# Patient Record
Sex: Male | Born: 1970 | Race: White | Hispanic: No | Smoking: Former smoker
Health system: Southern US, Community
[De-identification: ages and names within clinical notes are randomized; demographics above are authoritative.]

## PROBLEM LIST (undated history)

## (undated) DIAGNOSIS — I10 Essential (primary) hypertension: Secondary | ICD-10-CM

## (undated) HISTORY — DX: Essential (primary) hypertension: I10

## (undated) HISTORY — PX: COSMETIC SURGERY: SHX468

---

## 2000-10-09 ENCOUNTER — Encounter: Payer: Self-pay | Admitting: Family Medicine

## 2000-10-09 ENCOUNTER — Ambulatory Visit (HOSPITAL_COMMUNITY): Admission: RE | Admit: 2000-10-09 | Discharge: 2000-10-09 | Payer: Self-pay | Admitting: Family Medicine

## 2000-10-16 ENCOUNTER — Encounter: Payer: Self-pay | Admitting: Family Medicine

## 2000-10-16 ENCOUNTER — Ambulatory Visit (HOSPITAL_COMMUNITY): Admission: RE | Admit: 2000-10-16 | Discharge: 2000-10-16 | Payer: Self-pay | Admitting: Family Medicine

## 2002-04-20 ENCOUNTER — Emergency Department (HOSPITAL_COMMUNITY): Admission: EM | Admit: 2002-04-20 | Discharge: 2002-04-20 | Payer: Self-pay | Admitting: *Deleted

## 2006-04-27 ENCOUNTER — Emergency Department (HOSPITAL_COMMUNITY): Admission: EM | Admit: 2006-04-27 | Discharge: 2006-04-27 | Payer: Self-pay | Admitting: Emergency Medicine

## 2015-09-19 ENCOUNTER — Emergency Department (HOSPITAL_COMMUNITY)
Admission: EM | Admit: 2015-09-19 | Discharge: 2015-09-19 | Disposition: A | Discharge status: Other Acute Inpatient Hospital | Payer: BLUE CROSS/BLUE SHIELD | Attending: Psychiatry | Admitting: Psychiatry

## 2015-09-19 ENCOUNTER — Encounter (HOSPITAL_COMMUNITY): Payer: Self-pay | Admitting: *Deleted

## 2015-09-19 ENCOUNTER — Emergency Department (HOSPITAL_COMMUNITY): Admission: EM | Admit: 2015-09-19 | Discharge: 2015-09-19 | Discharge status: Absent without leave | Payer: Self-pay

## 2015-09-19 ENCOUNTER — Encounter (HOSPITAL_COMMUNITY): Payer: Self-pay | Admitting: Emergency Medicine

## 2015-09-19 ENCOUNTER — Observation Stay (HOSPITAL_COMMUNITY)
Admission: EM | Admit: 2015-09-19 | Discharge: 2015-09-20 | Disposition: A | Discharge status: Home / Self Care | Payer: BLUE CROSS/BLUE SHIELD | Source: Intra-hospital | Attending: Psychiatry | Admitting: Psychiatry

## 2015-09-19 DIAGNOSIS — F329 Major depressive disorder, single episode, unspecified: Secondary | ICD-10-CM

## 2015-09-19 DIAGNOSIS — R45851 Suicidal ideations: Secondary | ICD-10-CM | POA: Diagnosis not present

## 2015-09-19 DIAGNOSIS — F320 Major depressive disorder, single episode, mild: Secondary | ICD-10-CM | POA: Diagnosis present

## 2015-09-19 DIAGNOSIS — F4323 Adjustment disorder with mixed anxiety and depressed mood: Secondary | ICD-10-CM | POA: Diagnosis not present

## 2015-09-19 DIAGNOSIS — F17200 Nicotine dependence, unspecified, uncomplicated: Secondary | ICD-10-CM | POA: Insufficient documentation

## 2015-09-19 DIAGNOSIS — F32A Depression, unspecified: Secondary | ICD-10-CM

## 2015-09-19 DIAGNOSIS — F17210 Nicotine dependence, cigarettes, uncomplicated: Secondary | ICD-10-CM | POA: Diagnosis not present

## 2015-09-19 LAB — COMPREHENSIVE METABOLIC PANEL
ALBUMIN: 5.1 g/dL — AB (ref 3.5–5.0)
ALK PHOS: 63 U/L (ref 38–126)
ALT: 21 U/L (ref 17–63)
AST: 24 U/L (ref 15–41)
Anion gap: 9 (ref 5–15)
BILIRUBIN TOTAL: 2.8 mg/dL — AB (ref 0.3–1.2)
BUN: 12 mg/dL (ref 6–20)
CHLORIDE: 105 mmol/L (ref 101–111)
CO2: 24 mmol/L (ref 22–32)
CREATININE: 0.92 mg/dL (ref 0.61–1.24)
Calcium: 9.6 mg/dL (ref 8.9–10.3)
GFR calc non Af Amer: >60 mL/min (ref >60)
Glucose, Bld: 111 mg/dL — ABNORMAL HIGH (ref 65–99)
Potassium: 3.8 mmol/L (ref 3.5–5.1)
Sodium: 138 mmol/L (ref 135–145)
Total Protein: 7.6 g/dL (ref 6.5–8.1)

## 2015-09-19 LAB — ETHANOL: Alcohol, Ethyl (B): <5 mg/dL (ref <5)

## 2015-09-19 LAB — CBC
HEMATOCRIT: 43.6 % (ref 39.0–52.0)
HEMOGLOBIN: 15.2 g/dL (ref 13.0–17.0)
MCH: 32.8 pg (ref 26.0–34.0)
MCHC: 34.9 g/dL (ref 30.0–36.0)
MCV: 94.2 fL (ref 78.0–100.0)
Platelets: 237 10*3/uL (ref 150–400)
RBC: 4.63 MIL/uL (ref 4.22–5.81)
RDW: 12.2 % (ref 11.5–15.5)
WBC: 10 10*3/uL (ref 4.0–10.5)

## 2015-09-19 LAB — SALICYLATE LEVEL: Salicylate Lvl: <4 mg/dL (ref 2.8–30.0)

## 2015-09-19 LAB — RAPID URINE DRUG SCREEN, HOSP PERFORMED
AMPHETAMINES: NOT DETECTED
Barbiturates: NOT DETECTED
Benzodiazepines: NOT DETECTED
Cocaine: NOT DETECTED
Opiates: NOT DETECTED
TETRAHYDROCANNABINOL: NOT DETECTED

## 2015-09-19 LAB — ACETAMINOPHEN LEVEL: Acetaminophen (Tylenol), Serum: <10 ug/mL — ABNORMAL LOW (ref 10–30)

## 2015-09-19 MED ORDER — ACETAMINOPHEN 325 MG PO TABS: 650.0000 mg | ORAL_TABLET | Freq: Four times a day (QID) | ORAL | Status: DC | PRN

## 2015-09-19 MED ORDER — ALUM & MAG HYDROXIDE-SIMETH 200-200-20 MG/5ML PO SUSP: 30.0000 mL | ORAL | Status: DC | PRN

## 2015-09-19 MED ORDER — MAGNESIUM HYDROXIDE 400 MG/5ML PO SUSP: 30.0000 mL | Freq: Every day | ORAL | Status: DC | PRN

## 2015-09-19 MED ORDER — TRAZODONE HCL 50 MG PO TABS
50.0000 mg | ORAL_TABLET | Freq: Every evening | ORAL | Status: DC | PRN
  Administered 2015-09-19: 50 mg via ORAL

## 2015-09-19 MED ORDER — TRAZODONE HCL 50 MG PO TABS
ORAL_TABLET | ORAL | Status: AC
  Administered 2015-09-19: 23:00:00
  Filled 2015-09-19: qty 1

## 2015-09-19 NOTE — BH Assessment (Addendum)
Tele Assessment Note   Alex Mata is an 45 y.o. white male that reports vague SI without a plan.  Patient reports increased depression and anxiety due to his wife and children leaving him in February 2017.  Patient reports that his father died in November 28, 2016of cancer.    Patient reports that he is not able to focus or concentrate ion anything.  Patient reports that he misses his children because he is not able to see them everyday.   Patient reports that he blames himself for the separation with his wife.   However, patient would not state why he blames himself.  Patient reports that he lives alone and has worked as a Lobbyist for the past seven years at [employer]  Patient would cover his face several times throughout the assessment and then state that, "I should have coe to the ER a long time ago, I ned help".     Patient denies prior psychiatric hospitalization.  Patient denies prior medication management or outpatient therapy.  Patient denies HI/Psychosis/Substance Abuse.  Patient denies physical, sexual or emotional abuse.    Diagnosis: Major Depressive Disorder   Past Medical History: History reviewed. No pertinent past medical history.  History reviewed. No pertinent past surgical history.  Family History: History reviewed. No pertinent family history.  Social History:  reports that he has been smoking.  He does not have any smokeless tobacco history on file. He reports that he drinks alcohol. He reports that he does not use illicit drugs.  Additional Social History:  Alcohol / Drug Use History of alcohol / drug use?: No history of alcohol / drug abuse  CIWA: CIWA-Ar BP: 141/92 mmHg Pulse Rate: 97 COWS:    PATIENT STRENGTHS: (choose at least two) Average or above average intelligence Capable of independent living Psychologist, counselling means Physical Health Special hobby/interest Supportive family/friends Work skills  Allergies: No Known  Allergies  Home Medications:  (Not in a hospital admission)  OB/GYN Status:  No LMP for male patient.  General Assessment Data Location of Assessment: [facility] TTS Assessment: In system Is this a Tele or Face-to-Face Assessment?: Tele Assessment Is this an Initial Assessment or a Re-assessment for this encounter?: Initial Assessment Marital status: Separated Maiden name: NA Is patient pregnant?: No Pregnancy Status: No Living Arrangements: Alone Can pt return to current living arrangement?: Yes Admission Status: Voluntary Is patient capable of signing voluntary admission?: Yes Referral Source: Self/Family/Friend Insurance type: Medical sales representative     Crisis Care Plan Living Arrangements: Alone Legal Guardian:  (NA) Name of Psychiatrist: None Reported Name of Therapist: None Reported  Education Status Is patient currently in school?: No Current Grade: NA Highest grade of school patient has completed: 12th Name of school: NA Contact person: NA  Risk to self with the past 6 months Suicidal Ideation: No-Not Currently/Within Last 6 Months Has patient been a risk to self within the past 6 months prior to admission? : Yes Suicidal Intent: No Has patient had any suicidal intent within the past 6 months prior to admission? : No Is patient at risk for suicide?: No Suicidal Plan?: No Has patient had any suicidal plan within the past 6 months prior to admission? : No Access to Means: No What has been your use of drugs/alcohol within the last 12 months?: None Reported Previous Attempts/Gestures: No How many times?: 0 Other Self Harm Risks: None Reported Triggers for Past Attempts: None known Intentional Self Injurious Behavior: None Family Suicide History:  No Recent stressful life event(s): Divorce, Financial Problems, Loss (Comment) (Father died six months ago) Persecutory voices/beliefs?: No Depression: Yes Depression Symptoms: Despondent, Insomnia, Isolating, Fatigue, Guilt, Loss of  interest in usual pleasures, Feeling worthless/self pity, Feeling angry/irritable Substance abuse history and/or treatment for substance abuse?: No Suicide prevention information given to non-admitted patients: Yes  Risk to Others within the past 6 months Homicidal Ideation: No Does patient have any lifetime risk of violence toward others beyond the six months prior to admission? : No Thoughts of Harm to Others: No Current Homicidal Intent: No Current Homicidal Plan: No Access to Homicidal Means: No Identified Victim: NA History of harm to others?: No Assessment of Violence: None Noted Violent Behavior Description: None  Does patient have access to weapons?: No Criminal Charges Pending?: No Does patient have a court date: No Is patient on probation?: No  Psychosis Hallucinations: None noted Delusions: None noted  Mental Status Report Appearance/Hygiene: Disheveled Eye Contact: Good Motor Activity: Freedom of movement Speech: Logical/coherent Level of Consciousness: Alert, Restless Mood: Anxious, Depressed Affect: Anxious, Depressed Anxiety Level: Minimal Thought Processes: Coherent, Relevant Judgement: Unimpaired Orientation: Person, Place, Time, Situation Obsessive Compulsive Thoughts/Behaviors: None  Cognitive Functioning Concentration: Decreased Memory: Recent Intact, Remote Intact IQ: Average Insight: Fair Impulse Control: Fair Appetite: Fair Weight Loss: 0 Weight Gain: 0 Sleep: Decreased Total Hours of Sleep: 3 Vegetative Symptoms: Decreased grooming  ADLScreening [facility] Assessment Services) Patient's cognitive ability adequate to safely complete daily activities?: Yes Patient able to express need for assistance with ADLs?: Yes Independently performs ADLs?: Yes (appropriate for developmental age)  Prior Inpatient Therapy Prior Inpatient Therapy: No Prior Therapy Dates: NA Prior Therapy Facilty/Provider(s): NA Reason for Treatment: NA  Prior Outpatient  Therapy Prior Outpatient Therapy: No Prior Therapy Dates: NA Prior Therapy Facilty/Provider(s): NA Reason for Treatment: NA Does patient have an ACCT team?: No Does patient have Intensive In-House Services?  : No Does patient have [facility] services? : No Does patient have P4CC services?: No  ADL Screening (condition at time of admission) Patient's cognitive ability adequate to safely complete daily activities?: Yes Is the patient deaf or have difficulty hearing?: No Does the patient have difficulty seeing, even when wearing glasses/contacts?: No Does the patient have difficulty concentrating, remembering, or making decisions?: No Patient able to express need for assistance with ADLs?: Yes Does the patient have difficulty dressing or bathing?: No Independently performs ADLs?: Yes (appropriate for developmental age) Does the patient have difficulty walking or climbing stairs?: No Weakness of Legs: None Weakness of Arms/Hands: None  Home Assistive Devices/Equipment Home Assistive Devices/Equipment: None    Abuse/Neglect Assessment (Assessment to be complete while patient is alone) Physical Abuse: Denies Verbal Abuse: Denies Sexual Abuse: Denies Exploitation of patient/patient's resources: Denies Self-Neglect: Denies Values / Beliefs Cultural Requests During Hospitalization: None Spiritual Requests During Hospitalization: None Consults Spiritual Care Consult Needed: No Social Work Consult Needed: No Merchant navy officerAdvance Directives (For Healthcare) Does patient have an advance directive?: No Would patient like information on creating an advanced directive?: No - patient declined information    Additional Information 1:1 In Past 12 Months?: No CIRT Risk: No Elopement Risk: No Does patient have medical clearance?: Yes     Disposition: Per Shuvon, NP - patient accepted to the observation unit Bed 2 after 8pm.  Writer informed the ER RN.   Disposition Initial Assessment Completed for  this Encounter: Yes  Linton RumpStevenson, Birdia Jaycox LaVerne 09/19/2015 4:44 PM

## 2015-09-19 NOTE — ED Notes (Signed)
[facility], RN unavailable.

## 2015-09-19 NOTE — ED Notes (Signed)
Secretary called [facility]

## 2015-09-19 NOTE — BH Assessment (Signed)
Per Denice BorsShuvon, NP - patient meets criteria for OBS.  [facility] patient accepted to OBS Bed 2 after 8pm.  Writer informed the ER RN of the patients disposition.

## 2015-09-19 NOTE — ED Notes (Signed)
PT c/o difficulty with thought process, anxiety attacks and jittery with SI x3 days. PT states he needs some help and brother brought him to ED today for eval.

## 2015-09-19 NOTE — ED Notes (Signed)
[facility] called and advised RN [facility] after 2000 tonight.

## 2015-09-19 NOTE — ED Notes (Signed)
Pt's belongings given to pt's brother per patient request.

## 2015-09-19 NOTE — ED Notes (Signed)
Security has wanded pt.  

## 2015-09-19 NOTE — ED Provider Notes (Signed)
CSN: [id number]     Arrival date & time 09/19/15  1435 History   First MD Initiated Contact with Patient 09/19/15 1555     Chief Complaint  Patient presents with  . V70.1      HPI Pt was seen at 1555. Per pt and his brother, c/o gradual onset and worsening of persistent depression for the past several months, worse over the past several days. Pt states he believes his symptoms began "when my wife left me." Pt's brother states he was called by pt's daughter who "was worried about him," and "we think he's had a nervous breakdown." Pt endorses difficulty with thought processes, anxiety, and vague SI. Denies plan. Denies hallucinations, no HI, no SA.    History reviewed. No pertinent past medical history.   History reviewed. No pertinent past surgical history.  Social History  Substance Use Topics  . Smoking status: Light Tobacco Smoker  . Smokeless tobacco: None  . Alcohol Use: Yes     Comment: more often lately    Review of Systems ROS: Statement: All systems negative except as marked or noted in the HPI; Constitutional: Negative for fever and chills. ; ; Eyes: Negative for eye pain, redness and discharge. ; ; ENMT: Negative for ear pain, hoarseness, nasal congestion, sinus pressure and sore throat. ; ; Cardiovascular: Negative for chest pain, palpitations, diaphoresis, dyspnea and peripheral edema. ; ; Respiratory: Negative for cough, wheezing and stridor. ; ; Gastrointestinal: Negative for nausea, vomiting, diarrhea, abdominal pain, blood in stool, hematemesis, jaundice and rectal bleeding. . ; ; Genitourinary: Negative for dysuria, flank pain and hematuria. ; ; Musculoskeletal: Negative for back pain and neck pain. Negative for swelling and trauma.; ; Skin: Negative for pruritus, rash, abrasions, blisters, bruising and skin lesion.; ; Neuro: Negative for headache, lightheadedness and neck stiffness. Negative for weakness, altered level of consciousness , altered mental status, extremity  weakness, paresthesias, involuntary movement, seizure and syncope.; Psych:  +depression, anxiety, vague SI. No SA, no HI, no hallucinations.      Allergies  Review of patient's allergies indicates no known allergies.  Home Medications   Prior to Admission medications   Not on File   BP 141/92 mmHg  Pulse 97  Temp(Src) 97.3 F (36.3 C) (Oral)  Resp 18  Ht  (1.88 m)  Wt 185 lb (83.915 kg)  BMI 23.74 kg/m2  SpO2 98% Physical Exam  1600; Physical examination:  Nursing notes reviewed; Vital signs and O2 SAT reviewed;  Constitutional: Well developed, Well nourished, Well hydrated, In no acute distress; Head:  Normocephalic, atraumatic; Eyes: EOMI, PERRL, No scleral icterus; ENMT: Mouth and pharynx normal, Mucous membranes moist; Neck: Supple, Full range of motion; Cardiovascular: Regular rate and rhythm; Respiratory: Breath sounds clear, No wheezes.  Speaking full sentences with ease, Normal respiratory effort/excursion; Chest: No deformity, Movement normal; Abdomen: Soft, Nontender. Nondistended; Extremities: No deformity.; Neuro: AA&Ox3, Major CN grossly intact.  Speech clear. No gross focal motor deficits in extremities. Climbs on and off stretcher easily by himself. Gait steady.; Skin: Color normal, Warm, Dry.; Psych:  Affect flat, poor eye contact.      ED Course  Procedures (including critical care time) Labs Review  Imaging Review  I have personally reviewed and evaluated these images and lab results as part of my medical decision-making.   EKG Interpretation None      MDM  MDM Reviewed: previous chart, nursing note and vitals Interpretation: labs   Results for orders placed or performed during the  hospital encounter of 09/19/15  Comprehensive metabolic panel  Result Value Ref Range   Sodium 138 135 - 145 mmol/L   Potassium 3.8 3.5 - 5.1 mmol/L   Chloride 105 101 - 111 mmol/L   CO2 24 22 - 32 mmol/L   Glucose, Bld 111 (H) 65 - 99 mg/dL   BUN 12 6 - 20  mg/dL   Creatinine, Ser 1.610.92 0.61 - 1.24 mg/dL   Calcium 9.6 8.9 - 09.610.3 mg/dL   Total Protein 7.6 6.5 - 8.1 g/dL   Albumin 5.1 (H) 3.5 - 5.0 g/dL   AST 24 15 - 41 U/L   ALT 21 17 - 63 U/L   Alkaline Phosphatase 63 38 - 126 U/L   Total Bilirubin 2.8 (H) 0.3 - 1.2 mg/dL   GFR calc non Af Amer >60 >60 mL/min   GFR calc Af Amer >60 >60 mL/min   Anion gap 9 5 - 15  Ethanol (ETOH)  Result Value Ref Range   Alcohol, Ethyl (B) <5 <5 mg/dL  Salicylate level  Result Value Ref Range   Salicylate Lvl <4.0 2.8 - 30.0 mg/dL  Acetaminophen level  Result Value Ref Range   Acetaminophen (Tylenol), Serum <10 (L) 10 - 30 ug/mL  CBC  Result Value Ref Range   WBC 10.0 4.0 - 10.5 K/uL   RBC 4.63 4.22 - 5.81 MIL/uL   Hemoglobin 15.2 13.0 - 17.0 g/dL   HCT 04.543.6 40.939.0 - 81.152.0 %   MCV 94.2 78.0 - 100.0 fL   MCH 32.8 26.0 - 34.0 pg   MCHC 34.9 30.0 - 36.0 g/dL   RDW 91.412.2 78.211.5 - 95.615.5 %   Platelets 237 150 - 400 K/uL  Urine rapid drug screen (hosp performed) (Not [facility])  Result Value Ref Range   Opiates NONE DETECTED NONE DETECTED   Cocaine NONE DETECTED NONE DETECTED   Benzodiazepines NONE DETECTED NONE DETECTED   Amphetamines NONE DETECTED NONE DETECTED   Tetrahydrocannabinol NONE DETECTED NONE DETECTED   Barbiturates NONE DETECTED NONE DETECTED     1655:  TTS eval pending.   1800:  TTS has evaluated pt: pt meets obs criteria, [facility].     Samuel JesterKathleen Sarahmarie Leavey, DO 09/23/15 1756

## 2015-09-20 ENCOUNTER — Encounter (HOSPITAL_COMMUNITY): Payer: Self-pay | Admitting: Registered Nurse

## 2015-09-20 DIAGNOSIS — F4323 Adjustment disorder with mixed anxiety and depressed mood: Secondary | ICD-10-CM | POA: Diagnosis not present

## 2015-09-20 DIAGNOSIS — F320 Major depressive disorder, single episode, mild: Secondary | ICD-10-CM

## 2015-09-20 MED ORDER — ACETAMINOPHEN 325 MG PO TABS
650.0000 mg | ORAL_TABLET | Freq: Four times a day (QID) | ORAL | Status: DC | PRN
  Administered 2015-09-20: 650 mg via ORAL

## 2015-09-20 MED ORDER — TRAZODONE HCL 50 MG PO TABS: 50.0000 mg | ORAL_TABLET | Freq: Every evening | ORAL | Status: DC | PRN

## 2015-09-20 NOTE — Progress Notes (Signed)
Admission Note:  Patient is a 45 year old male from [facility]. Patient is on wheelchair but could not say why he is on wheelchair. Patient is irritable and responds "I don't know to every question this Clinical research associatewriter asked". Patient responded 'I can't stand when asked to stand to get his weight and height. Patient was not able to cooperate with the admission process.  This Clinical research associatewriter called [facility] to know why the patient ambulates on wheelchair since no prior information was given on report. The [facility] staff said that patient never used wheelchair while he was with them and he ambulates well.  "Patient  Delphina CahillWalked himself to the car" A: Skin/body search - no contraband found, no bruises/wound noted. POC and unit policies explained and understanding verbalized. Consents obtained. Accepted food and fluids offered.  R: Patient had no additional questions or concerns.

## 2015-09-20 NOTE — Discharge Instructions (Signed)
You are encouraged to continue with your scheduled appointment on 09/21/15 with your primary care Dr. Pearson GrippeJames Kim at 5:30 p.m. You are also encouraged [facility] and [facility] for continuation of care.

## 2015-09-20 NOTE — Progress Notes (Signed)
Patient to be discharged, belongings returned to patient

## 2015-09-20 NOTE — Discharge Summary (Signed)
Physician Discharge Summary Note  Patient:  Alex MossesRobert L Mata is an 45 y.o., male MRN:  [id number] DOB:  [birth year] Patient phone:  [phone] (home)  Patient address[place]   [address] [place] [id number],  Total Time spent with patient: 30 minutes  Date of Admission:  09/19/2015 Date of Discharge: 09/20/15  Reason for Admission:  Worsening anxiety, depression, and passive suicidal thoughts without a plan  Principal Problem: MDD (major depressive disorder), single episode, mild [facility](HCC) Discharge Diagnoses: Patient Active Problem List   Diagnosis Date Noted  . MDD (major depressive disorder), single episode, mild (HCC) [F32.0] 09/20/2015  . Adjustment disorder with mixed anxiety and depressed mood [F43.23] 09/20/2015  . MDD (major depressive disorder) (HCC) [F32.9] 09/19/2015    Past Psychiatric History: No prior psych history  Past Medical History: History reviewed. No pertinent past medical history. History reviewed. No pertinent past surgical history. Family History: History reviewed. No pertinent family history. Family Psychiatric  History: No family psych history Social History:  History  Alcohol Use  . Yes    Comment: more often lately     History  Drug Use No    Social History   Social History  . Marital Status: Legally Separated    Spouse Name: N/A  . Number of Children: N/A  . Years of Education: N/A   Social History Main Topics  . Smoking status: Light Tobacco Smoker  . Smokeless tobacco: None  . Alcohol Use: Yes     Comment: more often lately  . Drug Use: No  . Sexual Activity: Not Asked   Other Topics Concern  . None   Social History Narrative    Hospital Course:  [facility] for MDD (major depressive disorder), single episode, mild (HCC) and crisis management.  He was treated with the following medication Trazodone 50 mg for sleep.  Medication were tolerated with no adverse reactions.  Alex Mata  was discharged with no medication.    Improvement was monitored by observation and Alex Mata report of symptom reduction;  Patient reported that he is feeling better that he just felt overwhelmed yesterday with the children and had a panic attack; feels that he does want outpatient psych services and willing to start medication if needed.  Emotional and mental status was also monitored by staff.          Alex Mata was evaluated for stability and plans for continued recovery upon discharge.  Alex Mata motivation was an integral factor for scheduling further treatment.  Employment, transportation, bed availability, health status, family support, and any pending legal issues were also considered during his during the 24 hour observation.  He was offered further treatment options upon discharge including but not limited to Residential, Intensive Outpatient, Outpatient treatment, and resources for shelters if needed.  Alex Mossesobert L Liz will follow up with the services as listed below under Follow up Information.     Upon completion of this admission the [employer] General[employer] Camp was both mentally and medically stable for discharge denying suicidal/homicidal ideation, auditory/visual/tactile hallucinations, delusional thoughts and paranoia.      Physical Findings: AIMS:  , ,  ,  ,    CIWA:    COWS:     Musculoskeletal: Strength & Muscle Tone: within normal limits Gait & Station: normal Patient leans: N/A  Psychiatric Specialty Exam: ROS Psychiatric/Behavioral: Positive for depression (Stable). Negative for memory loss. Suicidal ideas: Denies. Hallucinations: Denies. Substance abuse:  ETOH. The patient is nervous/anxious (Stable). The patient does not have insomnia.   All other systems reviewed and are negative.   Blood pressure 138/87, pulse 70, temperature 98 F (36.7 C), temperature source Oral, resp. rate 14, height  (1.88 m), weight 83.915 kg (185 lb), SpO2 100 %.Body mass index is  23.74 kg/(m^2).    Musculoskeletal: Strength & Muscle Tone: within normal limits Gait & Station: normal Patient leans: N/A  Psychiatric Specialty Exam: Blood pressure 138/87, pulse 70, temperature 98 F (36.7 C), temperature source Oral, resp. rate 14, height  (1.88 m), weight 83.915 kg (185 lb), SpO2 100 %.Body mass index is 23.74 kg/(m^2).  General Appearance: Casual and Fairly Groomed  Eye Contact::  Good  Speech:  Blocked and Normal Rate  Volume:  Normal  Mood:  Anxious  Affect:  Appropriate  Thought Process:  Coherent and Goal Directed  Orientation:  Full (Time, Place, and Person)  Thought Content:  WDL  Suicidal Thoughts:  No  Homicidal Thoughts:  No  Memory:  Immediate;   Good Recent;   Good Remote;   Good  Judgement:  Intact  Insight:  Present  Psychomotor Activity:  Normal  Concentration:  Good  Recall:  Good  Fund of Knowledge:Good  Language: Good  Akathisia:  No  Handed:  Right  AIMS (if indicated):     Assets:  Communication Skills Desire for Improvement Housing Physical Health Resilience Social Support Transportation  ADL's:  Intact  Cognition: WNL  Sleep:       Metabolic Disorder Labs:  No results found for: HGBA1C, MPG No results found for: PROLACTIN No results found for: CHOL, TRIG, HDL, CHOLHDL, VLDL, LDLCALC  See Psychiatric Specialty Exam and Suicide Risk Assessment completed by Attending Physician prior to discharge.  Discharge destination:  Home  Is patient on multiple antipsychotic therapies at discharge:  No   Has Patient had three or more failed trials of antipsychotic monotherapy by history:  No  Recommended Plan for Multiple Antipsychotic Therapies: NA      Discharge Instructions    Activity as tolerated - No restrictions    Complete by:  As directed      Diet general    Complete by:  As directed      Discharge instructions    Complete by:  As directed   Keep all scheduled appointments.  Be sure to follow up with  resources and follow ups given. In the event of worsening symptoms call the crisis hotline, 911, and or go to the nearest emergency department for appropriate evaluation and treatment of symptoms. Follow-up with your primary care provider for your medical issues, concerns and or health care needs.            Medication List    Notice    You have not been prescribed any medications.     Follow-up Information    Follow up with [facility]. Go in 1 day.   Why:  Follow up and continuation of care,   Contact information:   [address] [place] [address] [phone]       Follow up with [facility] Call in 1 day.   Why:  continuation of care/ therapy   Contact information:   [address]  [address] [place] [address] [phone]       Follow up with Dr. Pearson Grippe. Go in 1 day.   Why:  already scheduled appointment   Contact information:  [address] [place], [place] [phone]      Follow-up recommendations:  Activity:  As tolerated Diet:  As tolerated  Comments:  Alex Mata has been instructed to follow up with primary doctor for any medical issues and If symptoms recur report to nearest emergency or crisis hot line.    SignedAssunta Found, NP 09/20/2015, 2:10 PM

## 2015-09-20 NOTE — H&P (Signed)
Observation: Psychiatric Admission Assessment Adult  Patient Identification: Alex Mata MRN:  [id number] Date of Evaluation:  09/20/2015 Chief Complaint:  MDD Principal Diagnosis: MDD (major depressive disorder), single episode, mild ([place]) Diagnosis:   Patient Active Problem List   Diagnosis Date Noted  . MDD (major depressive disorder), single episode, mild ([place]) [F32.0] 09/20/2015  . Adjustment disorder with mixed anxiety and depressed mood [F43.23] 09/20/2015  . MDD (major depressive disorder) ([place]) [F32.9] 09/19/2015   History of Present Illness:: Alex Mata 45 yr old white male admitted to [facility] for 24 hour observation after present to [facility] with complaints of worsening anxiety, depression since February 2017, and passive suicidal thoughts without a plan. Patient reports that his stressor for anxiety and depression is related to he and his wife getting a divorce.  States yesterday he had the kids for the weekend and they were out of control and he had a panic attack; his brother talked him into going to the hospital.  States that he is feeling better now and would like information on resources for outpatient services.  At this time patient denies suicidal/homicidal ideation, psychosis, and paranoia.  Patient is employed at [employer]); reports that he has a good support system (mother, brother, "and even my ex-wife).  Patient also reports that he had a drinking problem which he quit "cold [place]" states that he hasn't drank any alcohol in 3 weeks.   Associated Signs/Symptoms: Depression Symptoms:  depressed mood, suicidal thoughts without plan, anxiety, panic attacks, (Hypo) Manic Symptoms:  Impulsivity, Irritable Mood, Anxiety Symptoms:  Excessive Worry, Panic Symptoms, Psychotic Symptoms:  Denies PTSD Symptoms: Denies Total Time spent with patient: 30 minutes  Past Psychiatric History: Denies prior psych history  Is the patient at risk to self? No.   Has the patient been a risk to self in the past 6 months? No.  Has the patient been a risk to self within the distant past? No.  Is the patient a risk to others? No.  Has the patient been a risk to others in the past 6 months? No.  Has the patient been a risk to others within the distant past? No.   Prior Inpatient Therapy:   Prior Outpatient Therapy:    Alcohol Screening: 1. How often do you have a drink containing alcohol?: Monthly or less 2. How many drinks containing alcohol do you have on a typical day when you are drinking?: 1 or 2 3. How often do you have six or more drinks on one occasion?: Never Preliminary Score: 0 9. Have you or someone else been injured as a result of your drinking?: No 10. Has a relative or friend or a doctor or another health worker been concerned about your drinking or suggested you cut down?: No Alcohol Use Disorder Identification Test Final Score (AUDIT): 1 Brief Intervention: AUDIT score less than 7 or less-screening does not suggest unhealthy drinking-brief intervention not indicated Substance Abuse History in the last 12 months:  Yes.   Consequences of Substance Abuse: Denies Previous Psychotropic Medications: No  Psychological Evaluations: No  Past Medical History: History reviewed. No pertinent past medical history. History reviewed. No pertinent past surgical history. Family History: History reviewed. No pertinent family history. Family Psychiatric  History: Denies family psych history Tobacco Screening: @FLOW ([phone])::1)@ Social History:  History  Alcohol Use  . Yes    Comment: more often lately     History  Drug Use No    Additional Social History:  Allergies:  No Known Allergies Lab Results:  Results for orders placed or performed during the hospital encounter of 09/19/15 (from the past 48 hour(s))  Urine rapid drug screen (hosp performed) (Not at [facility])     Status: None   Collection Time:  09/19/15  4:00 PM  Result Value Ref Range   Opiates NONE DETECTED NONE DETECTED   Cocaine NONE DETECTED NONE DETECTED   Benzodiazepines NONE DETECTED NONE DETECTED   Amphetamines NONE DETECTED NONE DETECTED   Tetrahydrocannabinol NONE DETECTED NONE DETECTED   Barbiturates NONE DETECTED NONE DETECTED    Comment:        DRUG SCREEN FOR MEDICAL PURPOSES ONLY.  IF CONFIRMATION IS NEEDED FOR ANY PURPOSE, NOTIFY LAB WITHIN 5 DAYS.        LOWEST DETECTABLE LIMITS FOR URINE DRUG SCREEN Drug Class       Cutoff (ng/mL) Amphetamine      1000 Barbiturate      200 Benzodiazepine   235 Tricyclics       573 Opiates          300 Cocaine          300 THC              50   Comprehensive metabolic panel     Status: Abnormal   Collection Time: 09/19/15  4:13 PM  Result Value Ref Range   Sodium 138 135 - 145 mmol/L   Potassium 3.8 3.5 - 5.1 mmol/L   Chloride 105 101 - 111 mmol/L   CO2 24 22 - 32 mmol/L   Glucose, Bld 111 (H) 65 - 99 mg/dL   BUN 12 6 - 20 mg/dL   Creatinine, Ser 0.92 0.61 - 1.24 mg/dL   Calcium 9.6 8.9 - 10.3 mg/dL   Total Protein 7.6 6.5 - 8.1 g/dL   Albumin 5.1 (H) 3.5 - 5.0 g/dL   AST 24 15 - 41 U/L   ALT 21 17 - 63 U/L   Alkaline Phosphatase 63 38 - 126 U/L   Total Bilirubin 2.8 (H) 0.3 - 1.2 mg/dL   GFR calc non Af Amer >60 >60 mL/min   GFR calc Af Amer >60 >60 mL/min    Comment: (NOTE) The eGFR has been calculated using the CKD EPI equation. This calculation has not been validated in all clinical situations. eGFR's persistently <60 mL/min signify possible Chronic Kidney Disease.    Anion gap 9 5 - 15  Ethanol (ETOH)     Status: None   Collection Time: 09/19/15  4:13 PM  Result Value Ref Range   Alcohol, Ethyl (B) <5 <5 mg/dL    Comment:        LOWEST DETECTABLE LIMIT FOR SERUM ALCOHOL IS 5 mg/dL FOR MEDICAL PURPOSES ONLY   Salicylate level     Status: None   Collection Time: 09/19/15  4:13 PM  Result Value Ref Range   Salicylate Lvl <2.2 2.8 - 30.0  mg/dL  Acetaminophen level     Status: Abnormal   Collection Time: 09/19/15  4:13 PM  Result Value Ref Range   Acetaminophen (Tylenol), Serum <10 (L) 10 - 30 ug/mL    Comment:        THERAPEUTIC CONCENTRATIONS VARY SIGNIFICANTLY. A RANGE OF 10-30 ug/mL MAY BE AN EFFECTIVE CONCENTRATION FOR MANY PATIENTS. HOWEVER, SOME ARE BEST TREATED AT CONCENTRATIONS OUTSIDE THIS RANGE. ACETAMINOPHEN CONCENTRATIONS >150 ug/mL AT 4 HOURS AFTER INGESTION AND >50 ug/mL AT 12 HOURS AFTER INGESTION ARE OFTEN ASSOCIATED WITH TOXIC  REACTIONS.   CBC     Status: None   Collection Time: 09/19/15  4:13 PM  Result Value Ref Range   WBC 10.0 4.0 - 10.5 K/uL   RBC 4.63 4.22 - 5.81 MIL/uL   Hemoglobin 15.2 13.0 - 17.0 g/dL   HCT 43.6 39.0 - 52.0 %   MCV 94.2 78.0 - 100.0 fL   MCH 32.8 26.0 - 34.0 pg   MCHC 34.9 30.0 - 36.0 g/dL   RDW 12.2 11.5 - 15.5 %   Platelets 237 150 - 400 K/uL    Blood Alcohol level:  Lab Results  Component Value Date   ETH <5 62/56/3893    Metabolic Disorder Labs:  No results found for: HGBA1C, MPG No results found for: PROLACTIN No results found for: CHOL, TRIG, HDL, CHOLHDL, VLDL, LDLCALC  Current Medications: Current Facility-Administered Medications  Medication Dose Route Frequency Provider Last Rate Last Dose  . acetaminophen (TYLENOL) tablet 650 mg  650 mg Oral Q6H PRN Hampton Abbot, MD   650 mg at 09/20/15 1204  . alum & mag hydroxide-simeth (MAALOX/MYLANTA) 200-200-20 MG/5ML suspension 30 mL  30 mL Oral Q4H PRN Shuvon B Rankin, NP      . magnesium hydroxide (MILK OF MAGNESIA) suspension 30 mL  30 mL Oral Daily PRN Shuvon B Rankin, NP      . traZODone (DESYREL) tablet 50 mg  50 mg Oral QHS PRN Hampton Abbot, MD       No current outpatient prescriptions on file.   PTA Medications: No prescriptions prior to admission    Musculoskeletal: Strength & Muscle Tone: within normal limits Gait & Station: normal Patient leans: N/A  Psychiatric Specialty  Exam: Physical Exam  Neck: Normal range of motion.  Respiratory: Effort normal.  Musculoskeletal: Normal range of motion.  Neurological: He is alert.  Skin: Skin is warm and dry.  Psychiatric: His behavior is normal. Thought content normal. His mood appears anxious. Thought content is not paranoid and not delusional. He expresses impulsivity. He exhibits a depressed mood. He expresses no homicidal and no suicidal ideation.    Review of Systems  Psychiatric/Behavioral: Positive for depression (Stable). Negative for memory loss. Suicidal ideas: Denies. Hallucinations: Denies. Substance abuse: ETOH. The patient is nervous/anxious (Stable). The patient does not have insomnia.   All other systems reviewed and are negative.   Blood pressure 138/87, pulse 70, temperature 98 F (36.7 C), temperature source Oral, resp. rate 14, height 6' 2"  (1.88 m), weight 83.915 kg (185 lb), SpO2 100 %.Body mass index is 23.74 kg/(m^2).  General Appearance: Casual and Fairly Groomed  Eye Contact::  Good  Speech:  Blocked and Normal Rate  Volume:  Normal  Mood:  Anxious  Affect:  Appropriate  Thought Process:  Coherent and Goal Directed  Orientation:  Full (Time, Place, and Person)  Thought Content:  WDL  Suicidal Thoughts:  No  Homicidal Thoughts:  No  Memory:  Immediate;   Good Recent;   Good Remote;   Good  Judgement:  Intact  Insight:  Present  Psychomotor Activity:  Normal  Concentration:  Good  Recall:  Good  Fund of Knowledge:Good  Language: Good  Akathisia:  No  Handed:  Right  AIMS (if indicated):     Assets:  Communication Skills Desire for [place] [employer]  ADL's:  Intact  Cognition: WNL  Sleep:        Treatment Plan Summary: Plan Discharge home with resource for outpatient psych services  Observation Level/Precautions:  15 minute checks for safety  Laboratory:  CBC Chemistry Profile UDS [facility] Hospital labs reviewed   Psychotherapy:  As appropriate; Outpatient therapy recommended  Medications:  Trazodone 50 mg Q hs prn for sleep  Consultations:  As appropriate  Discharge Concerns:  Safety, stabilization, and access to medication  Estimated LOS:  24 hours  Other:      Disposition: Discharge home.  Resources information given for outpatient services (medication management and therapy)  Follow-up Information    Follow up with [facility]. Go in 1 day.   Why:  Follow up and continuation of care,   Contact information:   [address] [place] [address] [phone]       Follow up with [facility]. Call in 1 day.   Why:  continuation of care/ therapy   Contact information:   [address]  [place] [address] [phone]       Follow up with Dr. Jani Gravel. Go in 1 day.   Why:  already scheduled appointment   Contact information:   [address] [place], [place] [phone]      I certify that inpatient services furnished can reasonably be expected to improve the patient's condition.    Rankin, Delphia Grates, NP 3/19/20171:59 PM

## 2015-09-20 NOTE — [facility] Counselor (Signed)
Spoke with pt. This a.m. Pt. States he is ready to return home and states that he will follow up with his Dr. In [place], unsure of name and exact location at this time.  Alex Mata, LCAS-A, LPC-A, [facility]  Counselor 09/20/2015 8:32 AM

## 2015-09-20 NOTE — Progress Notes (Signed)
Patient is increasingly anxious. Patient speech is clear and logical. He is fidgety and states that bed is uncomfortable. Patient reports no SI, HI, and AVH. Patient is able to walk to bathroom and is getting up from recliner frequently to walk around.  Patient safety maintained through constant observation. Encouragement and support offered.   Patient is compliant at this time. Will continue to monitor.

## 2015-09-23 ENCOUNTER — Telehealth: Payer: Self-pay

## 2015-09-23 NOTE — Telephone Encounter (Signed)
Pt was referred by Dr. Selena BattenKim for colonoscopy. Pt is 45 years old, but noted in chart pt has had abdominal pain. I called the phone number that Dr. Selena BattenKim sent of [phone] and it was not his number. I called the other two numbers listed in epic and one did not work and the other was not his number either. I am mailing the pt a letter to call.

## 2015-09-27 ENCOUNTER — Encounter (HOSPITAL_COMMUNITY): Payer: Self-pay

## 2015-09-27 ENCOUNTER — Emergency Department (HOSPITAL_COMMUNITY)
Admission: EM | Admit: 2015-09-27 | Discharge: 2015-09-28 | Disposition: A | Discharge status: Home / Self Care | Payer: BLUE CROSS/BLUE SHIELD | Attending: Emergency Medicine | Admitting: Emergency Medicine

## 2015-09-27 DIAGNOSIS — F17210 Nicotine dependence, cigarettes, uncomplicated: Secondary | ICD-10-CM | POA: Insufficient documentation

## 2015-09-27 DIAGNOSIS — F329 Major depressive disorder, single episode, unspecified: Secondary | ICD-10-CM | POA: Insufficient documentation

## 2015-09-27 DIAGNOSIS — F4323 Adjustment disorder with mixed anxiety and depressed mood: Secondary | ICD-10-CM | POA: Diagnosis not present

## 2015-09-27 LAB — COMPREHENSIVE METABOLIC PANEL
ALT: 19 U/L (ref 17–63)
AST: 20 U/L (ref 15–41)
Albumin: 4.4 g/dL (ref 3.5–5.0)
Alkaline Phosphatase: 53 U/L (ref 38–126)
Anion gap: 8 (ref 5–15)
BUN: 10 mg/dL (ref 6–20)
CHLORIDE: 106 mmol/L (ref 101–111)
CO2: 24 mmol/L (ref 22–32)
CREATININE: 0.7 mg/dL (ref 0.61–1.24)
Calcium: 8.9 mg/dL (ref 8.9–10.3)
GFR calc Af Amer: >60 mL/min (ref >60)
GLUCOSE: 115 mg/dL — AB (ref 65–99)
Potassium: 3.2 mmol/L — ABNORMAL LOW (ref 3.5–5.1)
Sodium: 138 mmol/L (ref 135–145)
Total Bilirubin: 2.3 mg/dL — ABNORMAL HIGH (ref 0.3–1.2)
Total Protein: 6.8 g/dL (ref 6.5–8.1)

## 2015-09-27 LAB — CBC WITH DIFFERENTIAL/PLATELET
Basophils Absolute: 0 10*3/uL (ref 0.0–0.1)
Basophils Relative: 0 %
EOS PCT: 0 %
Eosinophils Absolute: 0 10*3/uL (ref 0.0–0.7)
HEMATOCRIT: 39.8 % (ref 39.0–52.0)
Hemoglobin: 14 g/dL (ref 13.0–17.0)
LYMPHS ABS: 1.2 10*3/uL (ref 0.7–4.0)
LYMPHS PCT: 18 %
MCH: 32.6 pg (ref 26.0–34.0)
MCHC: 35.2 g/dL (ref 30.0–36.0)
MCV: 92.8 fL (ref 78.0–100.0)
MONO ABS: 0.6 10*3/uL (ref 0.1–1.0)
MONOS PCT: 9 %
NEUTROS ABS: 5 10*3/uL (ref 1.7–7.7)
Neutrophils Relative %: 73 %
PLATELETS: 206 10*3/uL (ref 150–400)
RBC: 4.29 MIL/uL (ref 4.22–5.81)
RDW: 12.2 % (ref 11.5–15.5)
WBC: 6.8 10*3/uL (ref 4.0–10.5)

## 2015-09-27 LAB — ETHANOL

## 2015-09-27 MED ORDER — ACETAMINOPHEN 325 MG PO TABS: 650.0000 mg | ORAL_TABLET | ORAL | Status: DC | PRN

## 2015-09-27 MED ORDER — ONDANSETRON HCL 4 MG PO TABS: 4.0000 mg | ORAL_TABLET | Freq: Three times a day (TID) | ORAL | Status: DC | PRN

## 2015-09-27 MED ORDER — LORAZEPAM 1 MG PO TABS: 1.0000 mg | ORAL_TABLET | Freq: Three times a day (TID) | ORAL | Status: DC | PRN

## 2015-09-27 MED ORDER — IBUPROFEN 400 MG PO TABS: 600.0000 mg | ORAL_TABLET | Freq: Three times a day (TID) | ORAL | Status: DC | PRN

## 2015-09-27 MED ORDER — ALUM & MAG HYDROXIDE-SIMETH 200-200-20 MG/5ML PO SUSP: 30.0000 mL | ORAL | Status: DC | PRN

## 2015-09-27 MED ORDER — POTASSIUM CHLORIDE CRYS ER 20 MEQ PO TBCR
40.0000 meq | EXTENDED_RELEASE_TABLET | Freq: Every day | ORAL | Status: DC
  Administered 2015-09-28: 40 meq via ORAL
  Filled 2015-09-27: qty 2

## 2015-09-27 NOTE — ED Notes (Addendum)
I keep having panic attacks and I don't know why.  He has been confused. Does not know where he is at. Not resting well per sister in law. It started around a year ago. It has been worse over the past 2 weeks. He was seen and sent to [facility] around two weeks ago.

## 2015-09-27 NOTE — ED Provider Notes (Signed)
CSN: [id number]     Arrival date & time 09/27/15  2123 History  By signing my name below, I, [facility], attest that this documentation has been prepared under the direction and in the presence of Alex CoreNathan Kerin Cecchi, MD. Electronically Signed: Randell PatientMarrissa Mata, ED Scribe. 09/27/2015. 11:42 PM.   No chief complaint on file.  The history is provided by the Mata and a relative. No language interpreter was used.   HPI Comments: Alex Mata is a 45 y.o. male brought in by his sister-in-law with an hx of depression who presents to the Emergency Department complaining of intermittent, moderate anxiety attacks ongoing for the past week. Mata reports that he has good and bad days [facility]. He endorses associated dysphoric mood and sister-in-law endorses sleep disturbance. Per medical records, he was seen 8 days ago by Dr. Clarene DukeMcManus where he was evaluated by TTS and accepted to [facility] for observation with mild follow-up. He is currently going through an acrimonious divorce. He has consumed EtOH but states it was a small amount. Denies illicit drug abuse. Denies SI.  History reviewed. No pertinent past medical history. History reviewed. No pertinent past surgical history. No family history on file. Social History  Substance Use Topics  . Smoking status: Light Tobacco Smoker  . Smokeless tobacco: None  . Alcohol Use: Yes     Comment: more often lately    Review of Systems  Psychiatric/Behavioral: Positive for sleep disturbance and dysphoric mood. Negative for suicidal ideas. The Mata is nervous/anxious.     Allergies  Review of Mata's allergies indicates no known allergies.  Home Medications   Prior to Admission medications   Not on File   BP 142/92 mmHg  Pulse 93  Temp(Src) 97.8 F (36.6 C) (Oral)  Resp 18  SpO2 100% Physical Exam  Constitutional: He appears well-developed and well-nourished.  HENT:  Head: Normocephalic and  atraumatic.  Cardiovascular: Normal rate and regular rhythm.   Pulmonary/Chest: Effort normal and breath sounds normal. No respiratory distress.  Lungs CTA bilaterally.  Abdominal: Soft. There is no tenderness.  Psychiatric: He exhibits a depressed mood.  Depressed.  Nursing note and vitals reviewed.   ED Course  Procedures    COORDINATION OF CARE: 10:16 PM Will order Ativan, ibuprofen, Tylenol, Maalox, and labs. Will consult with TTS. Discussed treatment plan with pt at bedside and pt agreed to plan.   Labs Review Labs Reviewed  COMPREHENSIVE METABOLIC PANEL - Abnormal; Notable for the following:    Potassium 3.2 (*)    Glucose, Bld 115 (*)    Total Bilirubin 2.3 (*)    All other components within normal limits  ETHANOL  CBC WITH DIFFERENTIAL/PLATELET  URINE RAPID DRUG SCREEN, HOSP PERFORMED    Imaging Review No results found. I have personally reviewed and evaluated these images and lab results as part of my medical decision-making.   EKG Interpretation None      MDM   Final diagnoses:  Single current episode of major depressive disorder, unspecified depression episode severity (HCC)  Mata was depression. Recently had been seen at behavioral health but has gotten worse or not improved. No real suicidal thoughts at this time but has not been functioning well at home. Appears medically cleared except for mild hypokalemia that'll be oral supplemented. To be seen by TTS. Mata is voluntary at this time.  I personally performed the services described in this documentation, which was scribed in my presence. The recorded information has been reviewed  and is accurate.      Alex Core, MD 09/27/15 [phone]

## 2015-09-28 ENCOUNTER — Encounter: Payer: Self-pay | Admitting: Internal Medicine

## 2015-09-28 ENCOUNTER — Emergency Department (HOSPITAL_COMMUNITY): Payer: BLUE CROSS/BLUE SHIELD

## 2015-09-28 DIAGNOSIS — F4323 Adjustment disorder with mixed anxiety and depressed mood: Secondary | ICD-10-CM

## 2015-09-28 LAB — RAPID URINE DRUG SCREEN, HOSP PERFORMED
AMPHETAMINES: NOT DETECTED
BARBITURATES: NOT DETECTED
Benzodiazepines: NOT DETECTED
Cocaine: NOT DETECTED
Opiates: NOT DETECTED
TETRAHYDROCANNABINOL: POSITIVE — AB

## 2015-09-28 LAB — URINALYSIS, ROUTINE W REFLEX MICROSCOPIC
Glucose, UA: NEGATIVE mg/dL
Hgb urine dipstick: NEGATIVE
KETONES UR: NEGATIVE mg/dL
LEUKOCYTES UA: NEGATIVE
NITRITE: NEGATIVE
PH: 8 (ref 5.0–8.0)
PROTEIN: NEGATIVE mg/dL
Specific Gravity, Urine: 1.005 (ref 1.005–1.030)

## 2015-09-28 NOTE — Consult Note (Signed)
Telepsych Consultation   Reason for Consult:  Depressive symptoms Referring Physician: EDP Patient Identification: Alex Mata MRN:  [id number] Principal Diagnosis: Adjustment disorder with mixed anxiety and depressed mood Diagnosis:   Patient Active Problem List   Diagnosis Date Noted  . MDD (major depressive disorder), single episode, mild ([place]) [F32.0] 09/20/2015  . Adjustment disorder with mixed anxiety and depressed mood [F43.23] 09/20/2015  . MDD (major depressive disorder) ([place]) [F32.9] 09/19/2015    Total Time spent with patient: 30 minutes  Subjective:   Alex Mata is a 45 y.o. male patient admitted with symptoms of depression and anxiety. Patient states "Yes I have been depressed. I lost my father and am getting divorced from wife. I think that things built up. I have not made it to get the outpatient treatment. I feel better today. The bad moods come and go. I would never hurt myself because of my family. I need to get back to work but I will make every effort to get seen. I do think that I might need something for depression at this point."   HPI:    Alex Mata is a 45 year old male who has developed symptoms of depression and anxiety over the last two years. He reports that it started with the death of his father stating "Things will never be the same." Patient describes period of depressed mood alternating with normal ones. He admits that thinking about his losses triggers the episodes of panic. Patient states "I probably never got the help I needed. Just kept pushing through life." [name] denies any suicidal thoughts but appears to have a depressed mood. He is alert and oriented during the assessment. Patient encouraged to pursue outpatient treatment as he does not feel an inpatient admission would be justified. Patient reports being open to the idea of being started on a medication to address his depressed and anxious mood. The patient was recently released from the  [facility] unit where he was not started on any psychotropic medication but rather provided with resources for outpatient follow up. Patient denies any current psychotic symptoms or those consistent with manic episodes. He reports that his children are a protective factor for him and a motivation to get better.   Past Psychiatric History: MDD single episode   Risk to Self: Suicidal Ideation: No-Not Currently/Within Last 6 Months Suicidal Intent: No Is patient at risk for suicide?: No Suicidal Plan?: No Access to Means: No What has been your use of drugs/alcohol within the last 12 months?: None reported Intentional Self Injurious Behavior: None Risk to Others: Homicidal Ideation: No Thoughts of Harm to Others: No Current Homicidal Intent: No Current Homicidal Plan: No Access to Homicidal Means: No History of harm to others?: No Assessment of Violence: None Noted Does patient have access to weapons?: No Criminal Charges Pending?: No Does patient have a court date: No Prior Inpatient Therapy: Prior Inpatient Therapy: Yes Prior Therapy Dates: 3.18.17-3.19.17 Prior Therapy Facilty/Provider(s): [facility] Reason for Treatment: anxiety, depression and psassive SI w/o plan Prior Outpatient Therapy: Prior Outpatient Therapy: No Does patient have an ACCT team?: No Does patient have Intensive In-House Services?  : No Does patient have [facility] services? : No Does patient have P4CC services?: No  Past Medical History: History reviewed. No pertinent past medical history. History reviewed. No pertinent past surgical history. Family History: No family history on file. Social History:  History  Alcohol Use  . Yes    Comment: more often lately  History  Drug Use No    Social History   Social History  . Marital Status: Legally Separated    Spouse Name: N/A  . Number of Children: N/A  . Years of Education: N/A   Social History Main Topics  . Smoking status: Light Tobacco Smoker  .  Smokeless tobacco: None  . Alcohol Use: Yes     Comment: more often lately  . Drug Use: No  . Sexual Activity: Not Asked   Other Topics Concern  . None   Social History Narrative   Additional Social History:    Allergies:  No Known Allergies  Labs:  Results for orders placed or performed during the hospital encounter of 09/27/15 (from the past 48 hour(s))  Comprehensive metabolic panel     Status: Abnormal   Collection Time: 09/27/15 10:46 PM  Result Value Ref Range   Sodium 138 135 - 145 mmol/L   Potassium 3.2 (L) 3.5 - 5.1 mmol/L   Chloride 106 101 - 111 mmol/L   CO2 24 22 - 32 mmol/L   Glucose, Bld 115 (H) 65 - 99 mg/dL   BUN 10 6 - 20 mg/dL   Creatinine, Ser 0.70 0.61 - 1.24 mg/dL   Calcium 8.9 8.9 - 10.3 mg/dL   Total Protein 6.8 6.5 - 8.1 g/dL   Albumin 4.4 3.5 - 5.0 g/dL   AST 20 15 - 41 U/L   ALT 19 17 - 63 U/L   Alkaline Phosphatase 53 38 - 126 U/L   Total Bilirubin 2.3 (H) 0.3 - 1.2 mg/dL   GFR calc non Af Amer >60 >60 mL/min   GFR calc Af Amer >60 >60 mL/min    Comment: (NOTE) The eGFR has been calculated using the CKD EPI equation. This calculation has not been validated in all clinical situations. eGFR's persistently <60 mL/min signify possible Chronic Kidney Disease.    Anion gap 8 5 - 15  Ethanol     Status: None   Collection Time: 09/27/15 10:46 PM  Result Value Ref Range   Alcohol, Ethyl (B) <5 <5 mg/dL    Comment:        LOWEST DETECTABLE LIMIT FOR SERUM ALCOHOL IS 5 mg/dL FOR MEDICAL PURPOSES ONLY   CBC with Differential     Status: None   Collection Time: 09/27/15 10:46 PM  Result Value Ref Range   WBC 6.8 4.0 - 10.5 K/uL   RBC 4.29 4.22 - 5.81 MIL/uL   Hemoglobin 14.0 13.0 - 17.0 g/dL   HCT 39.8 39.0 - 52.0 %   MCV 92.8 78.0 - 100.0 fL   MCH 32.6 26.0 - 34.0 pg   MCHC 35.2 30.0 - 36.0 g/dL   RDW 12.2 11.5 - 15.5 %   Platelets 206 150 - 400 K/uL   Neutrophils Relative % 73 %   Neutro Abs 5.0 1.7 - 7.7 K/uL   Lymphocytes Relative  18 %   Lymphs Abs 1.2 0.7 - 4.0 K/uL   Monocytes Relative 9 %   Monocytes Absolute 0.6 0.1 - 1.0 K/uL   Eosinophils Relative 0 %   Eosinophils Absolute 0.0 0.0 - 0.7 K/uL   Basophils Relative 0 %   Basophils Absolute 0.0 0.0 - 0.1 K/uL  Urine rapid drug screen (hosp performed)     Status: Abnormal   Collection Time: 09/28/15  8:19 AM  Result Value Ref Range   Opiates NONE DETECTED NONE DETECTED   Cocaine NONE DETECTED NONE DETECTED   Benzodiazepines NONE DETECTED  NONE DETECTED   Amphetamines NONE DETECTED NONE DETECTED   Tetrahydrocannabinol POSITIVE (A) NONE DETECTED   Barbiturates NONE DETECTED NONE DETECTED    Comment:        DRUG SCREEN FOR MEDICAL PURPOSES ONLY.  IF CONFIRMATION IS NEEDED FOR ANY PURPOSE, NOTIFY LAB WITHIN 5 DAYS.        LOWEST DETECTABLE LIMITS FOR URINE DRUG SCREEN Drug Class       Cutoff (ng/mL) Amphetamine      1000 Barbiturate      200 Benzodiazepine   426 Tricyclics       834 Opiates          300 Cocaine          300 THC              50   Urinalysis, Routine w reflex microscopic (not at [facility])     Status: Abnormal   Collection Time: 09/28/15  8:20 AM  Result Value Ref Range   Color, Urine YELLOW YELLOW   APPearance CLEAR CLEAR   Specific Gravity, Urine 1.005 1.005 - 1.030   pH 8.0 5.0 - 8.0   Glucose, UA NEGATIVE NEGATIVE mg/dL   Hgb urine dipstick NEGATIVE NEGATIVE   Bilirubin Urine SMALL (A) NEGATIVE   Ketones, ur NEGATIVE NEGATIVE mg/dL   Protein, ur NEGATIVE NEGATIVE mg/dL   Nitrite NEGATIVE NEGATIVE   Leukocytes, UA NEGATIVE NEGATIVE    Comment: MICROSCOPIC NOT DONE ON URINES WITH NEGATIVE PROTEIN, BLOOD, LEUKOCYTES, NITRITE, OR GLUCOSE <1000 mg/dL.    Current Facility-Administered Medications  Medication Dose Route Frequency Provider Last Rate Last Dose  . acetaminophen (TYLENOL) tablet 650 mg  650 mg Oral Q4H PRN Davonna Belling, MD      . alum & mag hydroxide-simeth (MAALOX/MYLANTA) 200-200-20 MG/5ML suspension 30 mL  30 mL  Oral PRN Davonna Belling, MD      . ibuprofen (ADVIL,MOTRIN) tablet 600 mg  600 mg Oral Q8H PRN Davonna Belling, MD      . LORazepam (ATIVAN) tablet 1 mg  1 mg Oral Q8H PRN Davonna Belling, MD      . ondansetron [facility]) tablet 4 mg  4 mg Oral Q8H PRN Davonna Belling, MD      . potassium chloride SA (K-DUR,KLOR-CON) CR tablet 40 mEq  40 mEq Oral Daily Davonna Belling, MD       No current outpatient prescriptions on file.    Musculoskeletal: Strength & Muscle Tone: within normal limits Gait & Station: normal Patient leans: N/A  Psychiatric Specialty Exam: Review of Systems  Constitutional: Negative.   HENT: Negative.   Eyes: Negative.   Respiratory: Negative.   Cardiovascular: Negative.   Gastrointestinal: Negative.   Genitourinary: Negative.   Musculoskeletal: Negative.   Skin: Negative.   Neurological: Negative.   Endo/Heme/Allergies: Negative.   Psychiatric/Behavioral: Positive for depression (Stable ). Negative for suicidal ideas, hallucinations, memory loss and substance abuse. The patient is nervous/anxious (Stable ). The patient does not have insomnia.     Blood pressure 127/78, pulse 64, temperature 98.1 F (36.7 C), temperature source Oral, resp. rate 16, SpO2 97 %.There is no weight on file to calculate BMI.  General Appearance: Casual  Eye Contact::  Good  Speech:  Clear and Coherent  Volume:  Decreased  Mood:  Depressed  Affect:  Appropriate  Thought Process:  Goal Directed and Intact  Orientation:  Full (Time, Place, and Person)  Thought Content:  Symptoms, worries, concerns   Suicidal Thoughts:  No  Homicidal Thoughts:  No  Memory:  Immediate;   Good Recent;   Good Remote;   Good  Judgement:  Intact  Insight:  Present  Psychomotor Activity:  Normal  Concentration:  Good  Recall:  Good  Fund of Knowledge:Good  Language: Good  Akathisia:  No  Handed:  Right  AIMS (if indicated):     Assets:  Communication Skills Desire for Improvement Financial  Resources/Insurance Housing Intimacy Leisure Time Physical Health Resilience Social Support Talents/Skills  ADL's:  Intact  Cognition: WNL  Sleep:      Treatment Plan Summary: Patient is safe to discharge home with a list of outpatient resources.   Disposition: No evidence of imminent risk to self or others at present.   Patient does not meet criteria for psychiatric inpatient admission. Supportive therapy provided about ongoing stressors. Discussed crisis plan, support from social network, calling 911, coming to the Emergency Department, and calling Suicide Hotline.  Elmarie Shiley, NP 09/28/2015 10:16 AM

## 2015-09-28 NOTE — ED Notes (Signed)
Per Fransisca KaufmannLaura Davis, [facility], pt can be discharged with outpt resources.  Aundra MilletMegan, SW [facility] will [facility]  List of resources.

## 2015-09-28 NOTE — ED Provider Notes (Signed)
TTS recommending a.m. psychiatric evaluation.  They are also requesting neurology evaluation for possible seizure. Nothing noted in Dr. Arlington CalixPickering's note regarding this.  He is medically cleared and basic labwork is reassuring without metabolic derangements.  Discuss with counselor who evaluated the patient. She states that he was disoriented during evaluation and at times could not answer per questions appropriately. Family reported multiple episodes of "staring off into space." On my evaluation, patient is generally uncooperative. He closes his eyes multiple times and will not answer questions. When asked whether he will talk to me says "no." He does know his name and his surroundings. He appears very depressed and withdrawn. Sister at the bedside states that he has episodes "better like anxiety." She states he gets "moody" and will not talk and becomes very withdrawn. Doubt seizures. Will add a urinalysis and a head CT. He may need outpatient neurology evaluation but at this time have low suspicion for seizures and suspect severe depression.  Shon Batonourtney F Kiyaan Haq, MD 09/28/15 [id number]

## 2015-09-28 NOTE — BH Assessment (Addendum)
Tele Assessment Note   Alex Mata is an 45 y.o. male. Pt presented as depressed and anxious with flat affect. Pt appeared confused at times throughout assessment. Pt was not oriented and was unable to verify name and date of birth. Pt was accompanied by sister in law Tresa Endo [phone]). When asked if he knew who the individual accompanying him was, pt avoided eye contact with Clinical research associate and sister-in law and did not respond. Sister in law did not believe pt remembered who she was.   Pt denies HI, SI and hallucinations. Pt was recently observed in [facility] observation unit (3.18.17-3.19-17) for anxiety, depression and passive SI w/o plan. Sister-in-law reports decrease in function (onset 2wks ago) and increased panic attacks. Sister in law and pt report daily "spells" or disorientation, confusion, lapse in memory and not talking or responding to questions. Sister in law reports that spells "comes and goes a lot" and states when he doesn't have the spells he's kind of like back to him but its like a boost of energy". Sister in law reports recent pt weight loss of 20-30lbs.   Sister in law reports multiple pt stressors including pending divorce from wife and loss of father.   Diagnosis: deferred  Past Medical History: History reviewed. No pertinent past medical history.  History reviewed. No pertinent past surgical history.  Family History: No family history on file.  Social History:  reports that he has been smoking.  He does not have any smokeless tobacco history on file. He reports that he drinks alcohol. He reports that he does not use illicit drugs.  Additional Social History:  Alcohol / Drug Use Pain Medications: None Reported Prescriptions: None Reported Over the Counter: None Reported  CIWA: CIWA-Ar BP: 142/92 mmHg Pulse Rate: 93 COWS:    PATIENT STRENGTHS: (choose at least two) Average or above average intelligence Supportive family/friends  Allergies: No Known Allergies  Home  Medications:  (Not in a hospital admission)  OB/GYN Status:  No LMP for male patient.  General Assessment Data Location of Assessment: [facility] TTS Assessment: In system Is this a Tele or Face-to-Face Assessment?: Tele Assessment Is this an Initial Assessment or a Re-assessment for this encounter?: Initial Assessment Marital status: Separated Maiden name: NA Is patient pregnant?: No Pregnancy Status: No Living Arrangements: Alone Can pt return to current living arrangement?: Yes Admission Status: Voluntary Is patient capable of signing voluntary admission?: No (n) Referral Source: Self/Family/Friend Insurance type: BCBS     Crisis Care Plan Living Arrangements: Alone Name of Psychiatrist: None Name of Therapist: None  Education Status Is patient currently in school?: No Highest grade of school patient has completed: 12th  Risk to self with the past 6 months Suicidal Ideation: No-Not Currently/Within Last 6 Months Has patient been a risk to self within the past 6 months prior to admission? : Yes (Per Chart) Suicidal Intent: No Has patient had any suicidal intent within the past 6 months prior to admission? : No Is patient at risk for suicide?: No Suicidal Plan?: No Has patient had any suicidal plan within the past 6 months prior to admission? : No Access to Means: No What has been your use of drugs/alcohol within the last 12 months?: None reported Previous Attempts/Gestures: No Intentional Self Injurious Behavior: None Family Suicide History: No Recent stressful life event(s): Divorce, Loss (Comment) (Loss of father) Depression: Yes Depression Symptoms: Isolating, Fatigue, Feeling angry/irritable Substance abuse history and/or treatment for substance abuse?: No Suicide prevention information given to non-admitted patients:  Not applicable  Risk to Others within the past 6 months Homicidal Ideation: No Does patient have any lifetime risk of violence toward others beyond  the six months prior to admission? : No Thoughts of Harm to Others: No Current Homicidal Intent: No Current Homicidal Plan: No Access to Homicidal Means: No History of harm to others?: No Assessment of Violence: None Noted Does patient have access to weapons?: No Criminal Charges Pending?: No Does patient have a court date: No Is patient on probation?: No  Psychosis Hallucinations: None noted Delusions: None noted  Mental Status Report Appearance/Hygiene: In scrubs Eye Contact: Poor Motor Activity: Unremarkable Speech: Soft (Pt stared w/o providing responses to multiple ?s) Level of Consciousness: Quiet/awake Mood: Depressed, Anxious Affect: Flat Anxiety Level: Moderate Thought Processes: Thought Blocking Judgement: Impaired Orientation: Not oriented Obsessive Compulsive Thoughts/Behaviors: None  Cognitive Functioning Concentration: Poor Memory: Recent Impaired, Remote Impaired IQ: Average Insight: Fair Impulse Control: Fair Appetite: Poor Weight Loss: 25 Weight Gain: 0 Sleep: Decreased Total Hours of Sleep: 4 Vegetative Symptoms: None  ADLScreening [facility] Services) Patient's cognitive ability adequate to safely complete daily activities?: Yes Patient able to express need for assistance with ADLs?: Yes Independently performs ADLs?: Yes (appropriate for developmental age)  Prior Inpatient Therapy Prior Inpatient Therapy: Yes Prior Therapy Dates: 3.18.17-3.19.17 Prior Therapy Facilty/Provider(s): [facility] Reason for Treatment: anxiety, depression and psassive SI w/o plan  Prior Outpatient Therapy Prior Outpatient Therapy: No Does patient have an ACCT team?: No Does patient have Intensive In-House Services?  : No Does patient have [facility] services? : No Does patient have P4CC services?: No  ADL Screening (condition at time of admission) Patient's cognitive ability adequate to safely complete daily activities?: Yes Is the patient deaf or have difficulty  hearing?: No Does the patient have difficulty seeing, even when wearing glasses/contacts?: No Does the patient have difficulty concentrating, remembering, or making decisions?: No Patient able to express need for assistance with ADLs?: Yes Does the patient have difficulty dressing or bathing?: No Independently performs ADLs?: Yes (appropriate for developmental age) Does the patient have difficulty walking or climbing stairs?: Yes Weakness of Legs: None Weakness of Arms/Hands: None  Home Assistive Devices/Equipment Home Assistive Devices/Equipment: None  Therapy Consults (therapy consults require a physician order) PT Evaluation Needed: No OT Evalulation Needed: No SLP Evaluation Needed: No Abuse/Neglect Assessment (Assessment to be complete while patient is alone) Physical Abuse: Denies Verbal Abuse: Denies Sexual Abuse: Denies Exploitation of patient/patient's resources: Denies Self-Neglect: Denies Values / Beliefs Cultural Requests During Hospitalization: None Spiritual Requests During Hospitalization: None Consults Spiritual Care Consult Needed: No Social Work Consult Needed: No Merchant navy officerAdvance Directives (For Healthcare) Does patient have an advance directive?: No Would patient like information on creating an advanced directive?: No - patient declined information    Additional Information 1:1 In Past 12 Months?: No CIRT Risk: No Elopement Risk: No Does patient have medical clearance?: No     Disposition: Writer consulted with Dr.Akhtar who recommends AM psych evaluation, as well as neurologist consult to r/o seizure. Writer informed Pt RN Darl Pikes(Susan) and EDP Dr.Horton of pt disposition.  Disposition Initial Assessment Completed for this Encounter: Yes Disposition of Patient: Referred to Patient referred to: Other (Comment) (Pending psychiatric recommendation)  Parish Dubose J [place] 09/28/2015 1:22 AM

## 2015-09-28 NOTE — ED Provider Notes (Signed)
Pt seen by psych Cleared for d/c home Pt denies SI Stable for d/c home   [name], MD 09/28/15 1211

## 2015-09-28 NOTE — ED Notes (Signed)
TTS machine placed at bedside.   

## 2015-09-28 NOTE — ED Notes (Signed)
Pt states that he feels better this morning and believes yesterday was just a pannic attack. Pt is alert and oriented. No difficulties noted. Pt has clear though process. Denies any SI/HI/AVH.

## 2015-09-28 NOTE — ED Notes (Signed)
Pt undergoing TTS re-eval at this time.

## 2015-09-28 NOTE — Discharge Instructions (Signed)
° °  [place] [facility] ° °[facility]  [employer] [facility] °[address]                 [address]         [address]  °[place]                                               [place]                              [place] °Phone:  [phone]                                  Phone:  [phone]                   Phone:  [phone] ° °[facility], [phone] °- [place] [facility] - [facility]- 1-888-581-9988 °      -     [facility] in [place], [address],                                  [phone], Insurance ° °[place] Child Abuse Hotline °(336) 342-1394 or (336) 342-3537 (After Hours) ° ° °Behavioral Health Services ° °Substance Abuse Resources: °- [facility]  336-882-2125 °- [facility] 336-784-9470 °- [facility] 336-285-9073 °- [facility] 336-845-3988 °- Residential & Outpatient Substance Abuse Program  800-659-3381 ° °Psychological Services: °- [facility]  832-9600 °- [facility]  378-7881 °- [facility], [address], [place], ACCESS LINE: 1-800-853-5163 or 336-641-4981, Http://www.[facility].com/services/adult.htm ° °-  °- [facility]- 342-8273 °- [facility]- 703-3100 °- [facility]- [phone] ° ° ° ° °

## 2015-10-20 ENCOUNTER — Ambulatory Visit: Payer: Self-pay | Admitting: Gastroenterology

## 2017-08-08 IMAGING — CT CT HEAD W/O CM
1 series · 16 of 30 positions shown, 20 images · non-contrast
Comparison: None.

CLINICAL DATA: Chronic worsening confusion and altered mental
status. Initial encounter.

EXAM:
CT HEAD WITHOUT CONTRAST
TECHNIQUE: Contiguous axial images were obtained from the base of the skull
through the vertex without intravenous contrast.

[Series 2: headseq 4.8 h37s · axial · 0.51mm/px · z∈[+148,+311]mm · 16 of 36 slices shown, 20 images]
[im 2/36  brain]
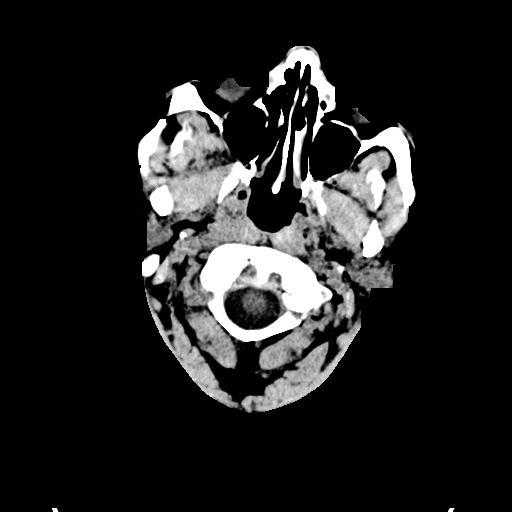
[im 2/36  bone]
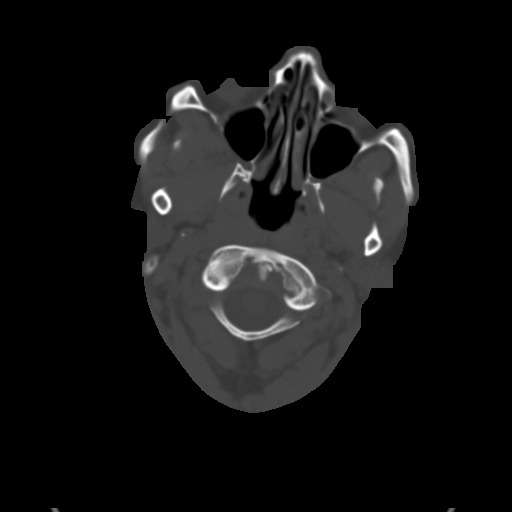
[im 4/36  brain]
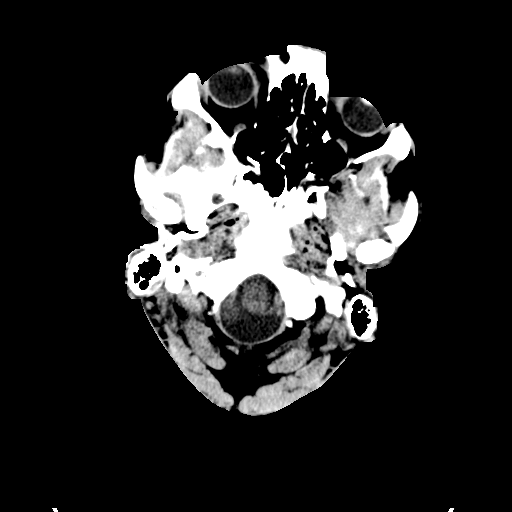
[im 7/36  brain]
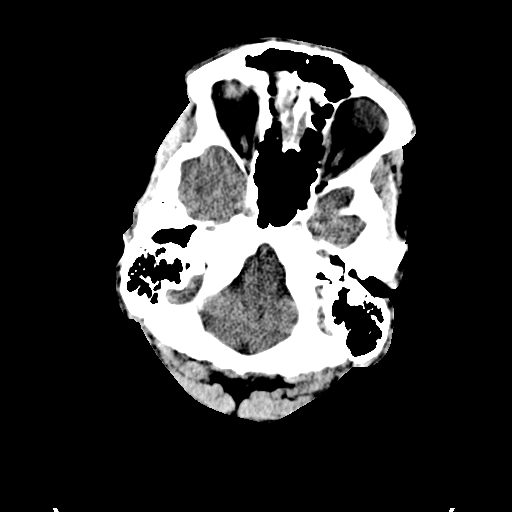
[im 9/36  brain]
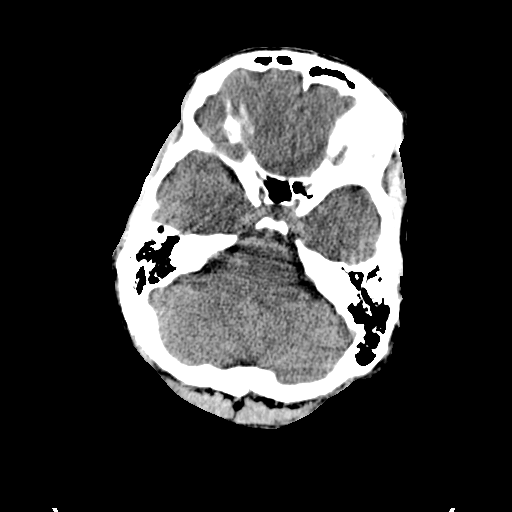
[im 10/36  brain]
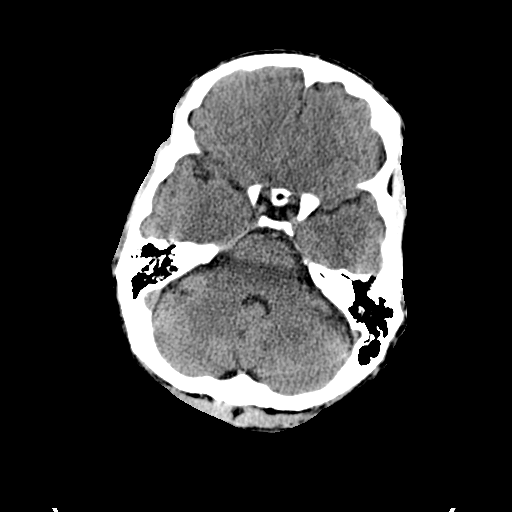
[im 10/36  bone]
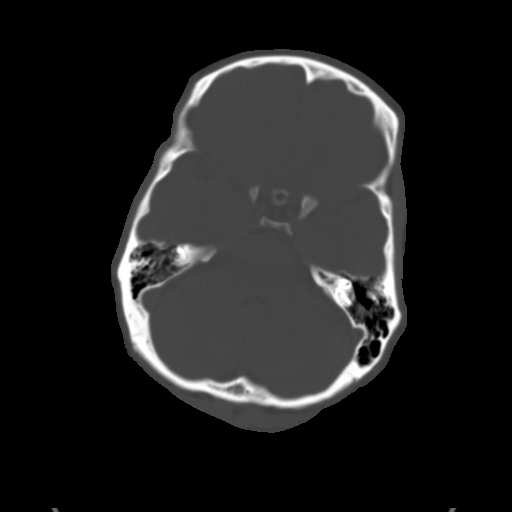
[im 13/36  brain]
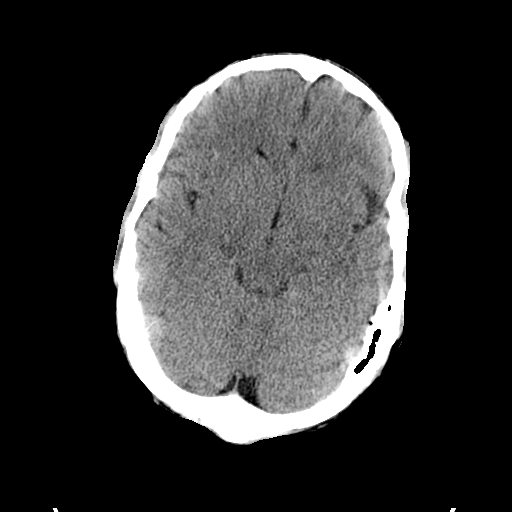
[im 15/36  brain]
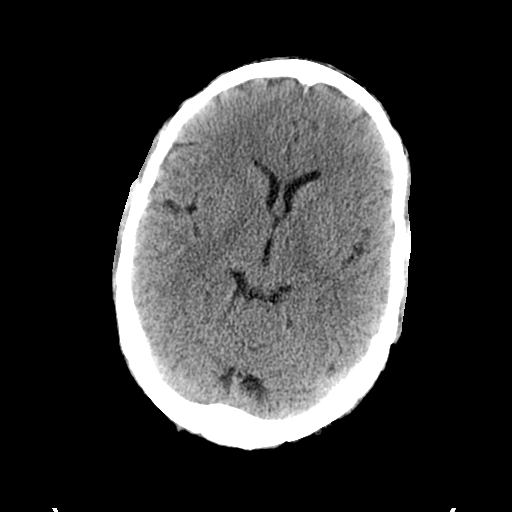
[im 17/36  brain]
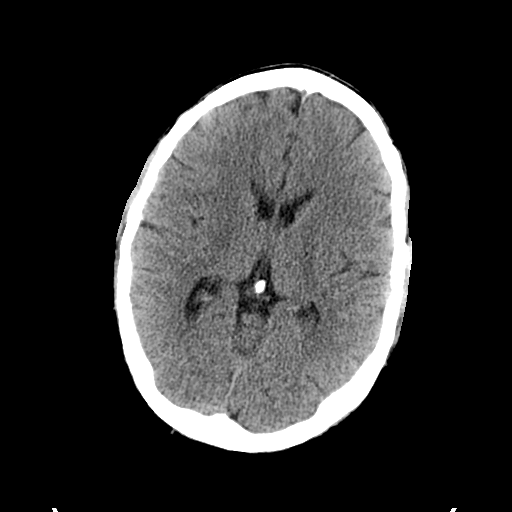
[im 19/36  brain]
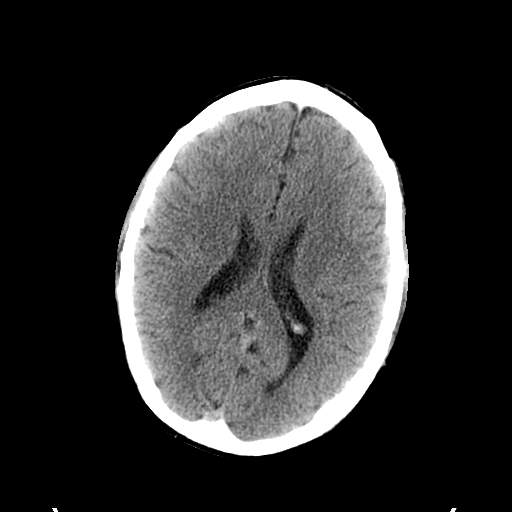
[im 19/36  bone]
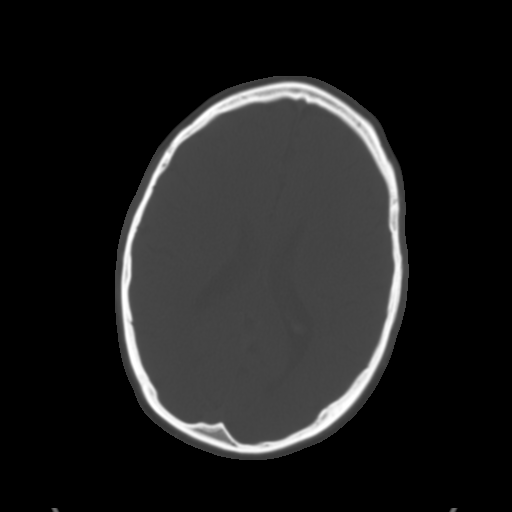
[im 21/36  brain]
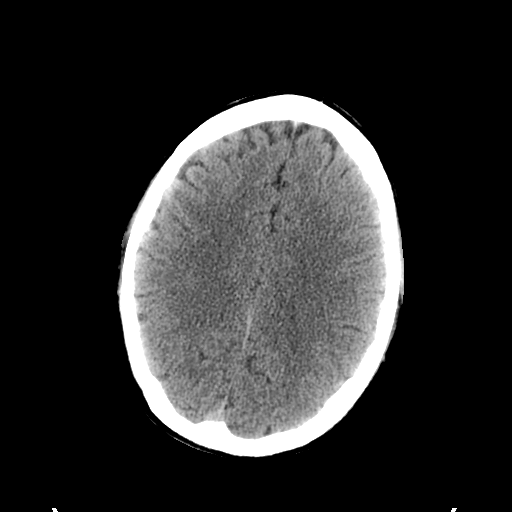
[im 23/36  brain]
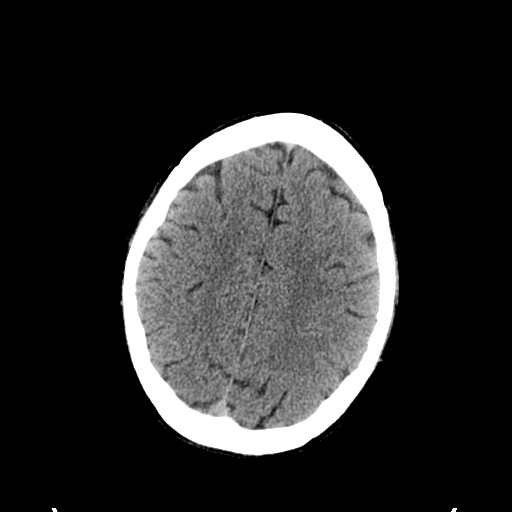
[im 26/36  brain]
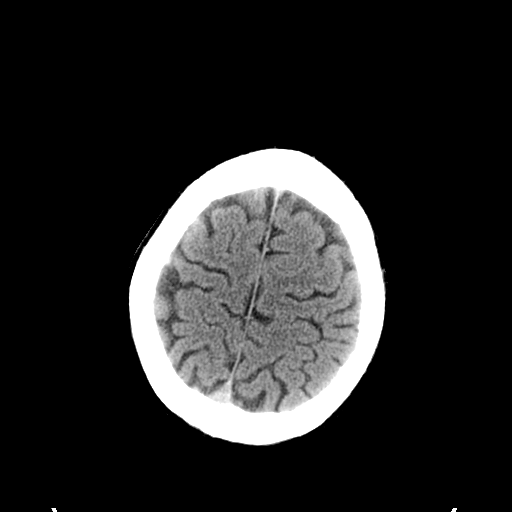
[im 27/36  brain]
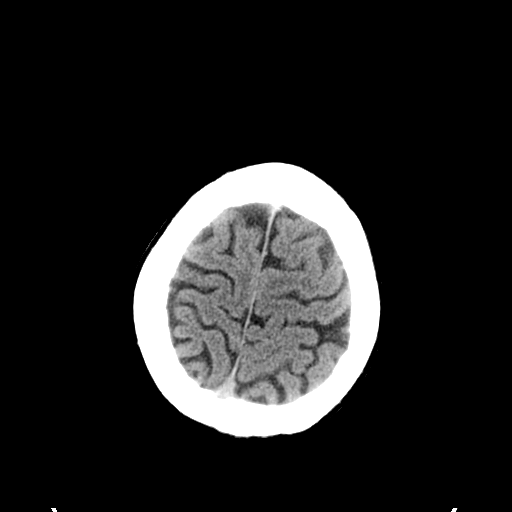
[im 27/36  bone]
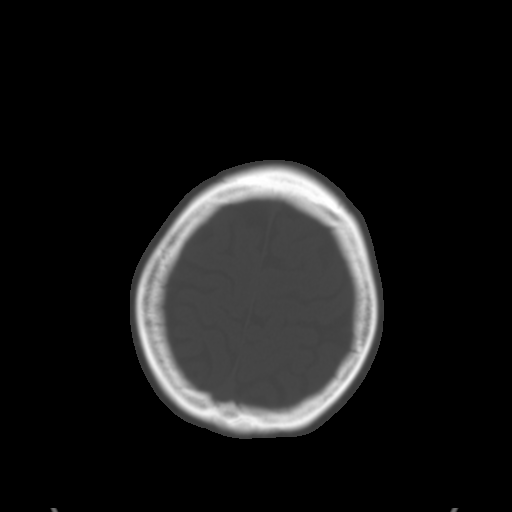
[im 29/36  brain]
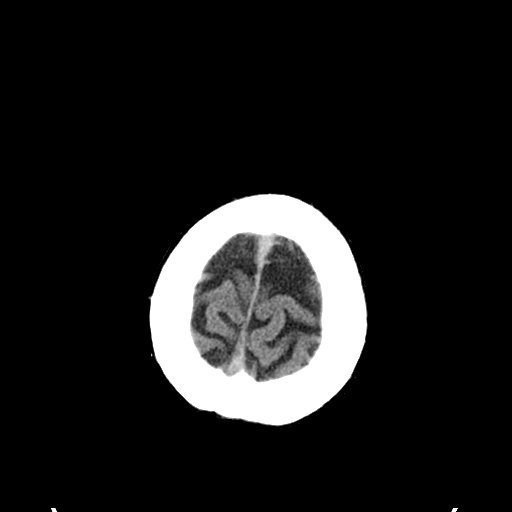
[im 32/36  brain]
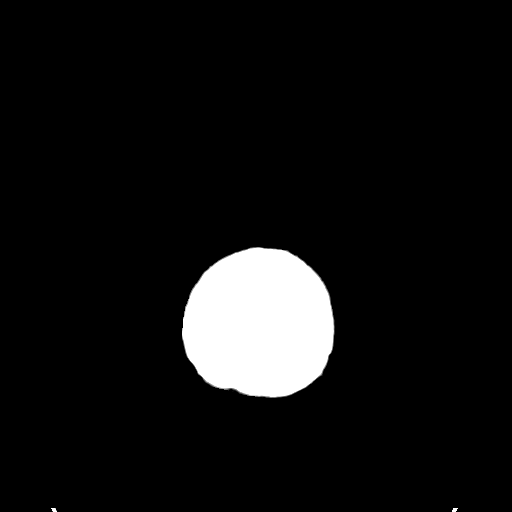
[im 34/36  brain]
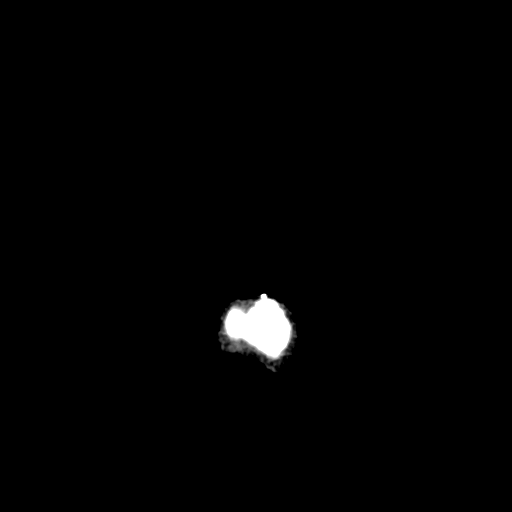

[16 of 30 positions shown; findings below may reference images not displayed]

FINDINGS: There is no evidence of acute infarction, mass lesion, or intra- or
extra-axial hemorrhage on CT.

The posterior fossa, including the cerebellum, brainstem and fourth
ventricle, is within normal limits. The third and lateral
ventricles, and basal ganglia are unremarkable in appearance. The
cerebral hemispheres are symmetric in appearance, with normal
gray-white differentiation. No mass effect or midline shift is seen.

There is no evidence of fracture; visualized osseous structures are
unremarkable in appearance. The visualized portions of the orbits
are within normal limits. The paranasal sinuses and mastoid air
cells are well-aerated. No significant soft tissue abnormalities are
seen.
IMPRESSION: Unremarkable noncontrast CT of the head.

## 2024-02-19 ENCOUNTER — Ambulatory Visit: Payer: Self-pay

## 2024-02-19 NOTE — Telephone Encounter (Signed)
 FYI Only or Action Required?: FYI only for provider.  Patient was last seen in primary care on - not seen .  Called Nurse Triage reporting dizziness - BP is not elevated.   Symptoms began several days ago.  Interventions attempted: took pt's blood pressure - will take BS today  Symptoms are: unchanged. Lightheaded  Triage Disposition: See Physician Within 24 Hours  Patient/caregiver understands and will follow disposition?: Yes. Spoke with pt's partner - she will take him to [facility] today, Scheduled sooner NP appt for pt.                  Copied from CRM #[id number]. Topic: Clinical - Red Word Triage >> Feb 19, 2024  9:11 AM Willma SAUNDERS wrote: Red Word that prompted transfer to Nurse Triage: Patient has been experiencing, dizziness, headaches, neck pain and high blood pressure since last Wednesday.  Glade his girlfriend is calling. Reason for Disposition  [1] MODERATE dizziness (e.g., interferes with normal activities) AND [2] has NOT been evaluated by doctor (or NP/PA) for this  (Exception: Dizziness caused by heat exposure, sudden standing, or poor fluid intake.)  Answer Assessment - Initial Assessment Questions 1. BLOOD PRESSURE: What is your blood pressure? Did you take at least two measurements 5 minutes apart?     110/92-94 2. ONSET: When did you take your blood pressure?     Saturday/ sunday 3. HOW: How did you take your blood pressure? (e.g., automatic home BP monitor, visiting nurse)     At [facility] on Saturday and Sunday 4. HISTORY: Do you have a history of high blood pressure?     unsure 5. MEDICINES: Are you taking any medicines for blood pressure? Have you missed any doses recently?     IBU, Tylenol , asa 81 6. OTHER SYMPTOMS: Do you have any symptoms? (e.g., blurred vision, chest pain, difficulty breathing, headache, weakness)     Dizziness, Neck pain - comes and goes.  Feels funny  Answer Assessment - Initial Assessment Questions 1.  DESCRIPTION: Describe your dizziness.     Feels funny 2. LIGHTHEADED: Do you feel lightheaded? (e.g., somewhat faint, woozy, weak upon standing)     yes 3. VERTIGO: Do you feel like either you or the room is spinning or tilting? (i.e., vertigo)     unsure 4. SEVERITY: How bad is it?  Do you feel like you are going to faint? Can you stand and walk?     unsure 5. ONSET:  When did the dizziness begin?     A few days ago 6. AGGRAVATING FACTORS: Does anything make it worse? (e.g., standing, change in head position)     unsure 9. RECURRENT SYMPTOM: Have you had dizziness before? If Yes, ask: When was the last time? What happened that time?     This weekend 10. OTHER SYMPTOMS: Do you have any other symptoms? (e.g., fever, chest pain, vomiting, diarrhea, bleeding)       no  Protocols used: Blood Pressure - High-A-AH, Dizziness - Lightheadedness-A-AH

## 2024-02-26 ENCOUNTER — Encounter: Payer: Self-pay | Admitting: Family Medicine

## 2024-02-26 ENCOUNTER — Ambulatory Visit (INDEPENDENT_AMBULATORY_CARE_PROVIDER_SITE_OTHER): Admitting: Family Medicine

## 2024-02-26 VITALS — BP 128/88 | HR 75 | Temp 97.9°F | Ht 74.0 in | Wt 211.8 lb

## 2024-02-26 DIAGNOSIS — Z1159 Encounter for screening for other viral diseases: Secondary | ICD-10-CM

## 2024-02-26 DIAGNOSIS — R030 Elevated blood-pressure reading, without diagnosis of hypertension: Secondary | ICD-10-CM | POA: Diagnosis not present

## 2024-02-26 DIAGNOSIS — Z0001 Encounter for general adult medical examination with abnormal findings: Secondary | ICD-10-CM

## 2024-02-26 DIAGNOSIS — Z23 Encounter for immunization: Secondary | ICD-10-CM

## 2024-02-26 DIAGNOSIS — Z1211 Encounter for screening for malignant neoplasm of colon: Secondary | ICD-10-CM

## 2024-02-26 DIAGNOSIS — Z0000 Encounter for general adult medical examination without abnormal findings: Secondary | ICD-10-CM | POA: Insufficient documentation

## 2024-02-26 DIAGNOSIS — Z114 Encounter for screening for human immunodeficiency virus [HIV]: Secondary | ICD-10-CM

## 2024-02-26 NOTE — Progress Notes (Signed)
 Pt. Reports having singles before Pt. Reports never having a colonoscopy  Pt. States due to his occupation he is interested in getting caught up on his immunizations

## 2024-02-26 NOTE — Assessment & Plan Note (Signed)
 BP <140/90 today. Encouraged to obtain a cuff for home and monitor. Return to office if sustains >140/90. Recommend heart healthy diet such as Mediterranean diet with whole grains, fruits, vegetable, fish, lean meats, nuts, and olive oil. Limit salt. Encouraged moderate walking, 3-5 times/week for 30-50 minutes each session. Aim for at least 150 minutes.week. Goal should be pace of 3 miles/hours, or walking 1.5 miles in 30 minutes. Avoid tobacco products. Avoid excess alcohol. Take medications as prescribed and bring medications and blood pressure log with cuff to each office visit. Seek medical care for chest pain, palpitations, shortness of breath with exertion, dizziness/lightheadedness, vision changes, recurrent headaches, or swelling of extremities.

## 2024-02-26 NOTE — Progress Notes (Signed)
 New Patient Office Visit  Subjective    Patient ID: Alex Mata, male    DOB: [birth year]  Age: 53 y.o. MRN: [id number]  CC:  Chief Complaint  Patient presents with   Establish Care    Stiff neck Concerns about B/P Exhaustion All x 3 weeks.     HPI LUAY BALDING presents to establish care. Oriented to practice routines and expectations. PMH includes none. Had a recent dental procedure and his BP was elevated. Has been monitoring at [facility] and reading was 128/90. Denies chest pain, palpitations, recurrent headaches, vision changes, lightheadedness, dizziness, dyspnea on exertion, or swelling of extremities.  Colon CA screening: ordered colonoscopy, father had colon ca Tobacco: non-smoker STI: declines Vaccines: tdap today, due for pneumonia, hep b      No outpatient encounter medications on file as of 02/26/2024.   No facility-administered encounter medications on file as of 02/26/2024.    Past Medical History:  Diagnosis Date   Hypertension     Past Surgical History:  Procedure Laterality Date   COSMETIC SURGERY      Family History  Problem Relation Age of Onset   Breast cancer Mother    Hypertension Mother    Colon cancer Father    Hypertension Brother    Diabetes Brother    Hypertension Brother     Social History   Socioeconomic History   Marital status: Legally Separated    Spouse name: Not on file   Number of children: 3   Years of education: Not on file   Highest education level: 10th grade  Occupational History   Occupation: Engineer, petroleum  Tobacco Use   Smoking status: Former    Average packs/day: 0.3 packs/day for 32.2 years (8.0 ttl pk-yrs)    Types: Cigarettes    Start date: 09/26/1991   Smokeless tobacco: Not on file  Vaping Use   Vaping status: Never Used  Substance and Sexual Activity   Alcohol use: Not Currently    Comment: more often lately   Drug use: No   Sexual activity: Yes    Birth control/protection: None  Other  Topics Concern   Not on file  Social History Narrative   Not on file   Social Drivers of Health   Financial Resource Strain: Not on file  Food Insecurity: Not on file  Transportation Needs: Not on file  Physical Activity: Not on file  Stress: Not on file  Social Connections: Not on file  Intimate Partner Violence: Not on file    Review of Systems  Constitutional: Negative.   HENT: Negative.    Eyes: Negative.   Respiratory: Negative.    Cardiovascular: Negative.   Gastrointestinal: Negative.   Genitourinary: Negative.   Musculoskeletal: Negative.   Skin: Negative.   Neurological: Negative.   Endo/Heme/Allergies: Negative.   Psychiatric/Behavioral: Negative.    All other systems reviewed and are negative.       Objective    BP 128/88   Pulse 75   Temp 97.9 F (36.6 C)   Ht 6' 2 (1.88 m)   Wt 211 lb 12.8 oz (96.1 kg)   SpO2 97%   BMI 27.19 kg/m   Physical Exam Vitals and nursing note reviewed.  Constitutional:      Appearance: Normal appearance. He is normal weight.  HENT:     Head: Normocephalic and atraumatic.     Right Ear: Tympanic membrane, ear canal and external ear normal.     Left Ear: Tympanic membrane,  ear canal and external ear normal.     Nose: Nose normal.     Mouth/Throat:     Mouth: Mucous membranes are moist.     Pharynx: Oropharynx is clear.  Eyes:     Extraocular Movements: Extraocular movements intact.     Right eye: Normal extraocular motion and no nystagmus.     Left eye: Normal extraocular motion and no nystagmus.     Conjunctiva/sclera: Conjunctivae normal.     Pupils: Pupils are equal, round, and reactive to light.  Cardiovascular:     Rate and Rhythm: Normal rate and regular rhythm.     Pulses: Normal pulses.     Heart sounds: Normal heart sounds.  Pulmonary:     Effort: Pulmonary effort is normal.     Breath sounds: Normal breath sounds.  Abdominal:     General: Bowel sounds are normal.     Palpations: Abdomen is soft.   Genitourinary:    Comments: Deferred using shared decision making Musculoskeletal:        General: Normal range of motion.     Cervical back: Normal range of motion and neck supple.  Skin:    General: Skin is warm and dry.     Capillary Refill: Capillary refill takes less than 2 seconds.  Neurological:     General: No focal deficit present.     Mental Status: He is alert. Mental status is at baseline.  Psychiatric:        Mood and Affect: Mood normal.        Speech: Speech normal.        Behavior: Behavior normal.        Thought Content: Thought content normal.        Cognition and Memory: Cognition and memory normal.        Judgment: Judgment normal.         Assessment & Plan:   Problem List Items Addressed This Visit     Elevated blood pressure reading without diagnosis of hypertension   BP <140/90 today. Encouraged to obtain a cuff for home and monitor. Return to office if sustains >140/90. Recommend heart healthy diet such as Mediterranean diet with whole grains, fruits, vegetable, fish, lean meats, nuts, and olive oil. Limit salt. Encouraged moderate walking, 3-5 times/week for 30-50 minutes each session. Aim for at least 150 minutes.week. Goal should be pace of 3 miles/hours, or walking 1.5 miles in 30 minutes. Avoid tobacco products. Avoid excess alcohol. Take medications as prescribed and bring medications and blood pressure log with cuff to each office visit. Seek medical care for chest pain, palpitations, shortness of breath with exertion, dizziness/lightheadedness, vision changes, recurrent headaches, or swelling of extremities.       Physical exam, annual - Primary   Today your medical history was reviewed and routine physical exam with labs was performed. Recommend 150 minutes of moderate intensity exercise weekly and consuming a well-balanced diet. Advised to stop smoking if a smoker, avoid smoking if a non-smoker, limit alcohol consumption to 1 drink per day for  women and 2 drinks per day for men, and avoid illicit drug use. Counseled on safe sex practices and offered STI testing today. Counseled on the importance of sunscreen use. Counseled in mental health awareness and when to seek medical care. Vaccine maintenance discussed. Appropriate health maintenance items reviewed. Return to office in 1 year for annual physical exam.       Relevant Orders   CBC with Differential/Platelet   Comprehensive  metabolic panel with GFR   Lipid panel   Hemoglobin A1c   Vitamin D, 25-OH,Total,IA(Refl)   TSH   Hepatitis C antibody   HIV Antibody (routine testing w rflx)   Other Visit Diagnoses       Screening for HIV (human immunodeficiency virus)       Relevant Orders   HIV Antibody (routine testing w rflx)     Need for hepatitis C screening test       Relevant Orders   Hepatitis C antibody     Colon cancer screening       Relevant Orders   Ambulatory referral to Gastroenterology       Return in about 1 year (around 02/25/2025) for annual physical with labs 1 week prior.   Jeoffrey GORMAN Barrio, FNP

## 2024-02-26 NOTE — Assessment & Plan Note (Signed)

## 2024-02-26 NOTE — Patient Instructions (Signed)
 It was great to meet you today and I'm excited to have you join the [facility] practice. I hope you had a positive experience today! If you feel so inclined, please feel free to recommend our practice to friends and family. Kimble Hitchens, FNP-C   Blood pressure goal < 140/90

## 2024-02-27 ENCOUNTER — Encounter (INDEPENDENT_AMBULATORY_CARE_PROVIDER_SITE_OTHER): Payer: Self-pay | Admitting: *Deleted

## 2024-02-27 ENCOUNTER — Ambulatory Visit: Payer: Self-pay | Admitting: Family Medicine

## 2024-02-27 LAB — LIPID PANEL
Cholesterol: 163 mg/dL (ref <200)
HDL: 35 mg/dL — ABNORMAL LOW (ref >=40)
LDL Cholesterol (Calc): 105 mg/dL — ABNORMAL HIGH
Non-HDL Cholesterol (Calc): 128 mg/dL (ref <130)
Total CHOL/HDL Ratio: 4.7 (calc) (ref <5.0)
Triglycerides: 134 mg/dL (ref <150)

## 2024-02-27 LAB — CBC WITH DIFFERENTIAL/PLATELET
Absolute Lymphocytes: 1191 {cells}/uL (ref 850–3900)
Absolute Monocytes: 299 {cells}/uL (ref 200–950)
Basophils Absolute: 20 {cells}/uL (ref 0–200)
Basophils Relative: 0.4 %
Eosinophils Absolute: 20 {cells}/uL (ref 15–500)
Eosinophils Relative: 0.4 %
HCT: 45 % (ref 38.5–50.0)
Hemoglobin: 15.4 g/dL (ref 13.2–17.1)
MCH: 32.5 pg (ref 27.0–33.0)
MCHC: 34.2 g/dL (ref 32.0–36.0)
MCV: 94.9 fL (ref 80.0–100.0)
MPV: 10.5 fL (ref 7.5–12.5)
Monocytes Relative: 6.1 %
Neutro Abs: 3371 {cells}/uL (ref 1500–7800)
Neutrophils Relative %: 68.8 %
Platelets: 241 Thousand/uL (ref 140–400)
RBC: 4.74 Million/uL (ref 4.20–5.80)
RDW: 11.6 % (ref 11.0–15.0)
Total Lymphocyte: 24.3 %
WBC: 4.9 Thousand/uL (ref 3.8–10.8)

## 2024-02-27 LAB — COMPREHENSIVE METABOLIC PANEL WITH GFR
AG Ratio: 2.2 (calc) (ref 1.0–2.5)
ALT: 17 U/L (ref 9–46)
AST: 18 U/L (ref 10–35)
Albumin: 4.6 g/dL (ref 3.6–5.1)
Alkaline phosphatase (APISO): 68 U/L (ref 35–144)
BUN: 11 mg/dL (ref 7–25)
CO2: 24 mmol/L (ref 20–32)
Calcium: 9.6 mg/dL (ref 8.6–10.3)
Chloride: 105 mmol/L (ref 98–110)
Creat: 0.89 mg/dL (ref 0.70–1.30)
Globulin: 2.1 g/dL (ref 1.9–3.7)
Glucose, Bld: 94 mg/dL (ref 65–99)
Potassium: 4.6 mmol/L (ref 3.5–5.3)
Sodium: 139 mmol/L (ref 135–146)
Total Bilirubin: 1.2 mg/dL (ref 0.2–1.2)
Total Protein: 6.7 g/dL (ref 6.1–8.1)
eGFR: 103 mL/min/1.73m2 (ref >=60)

## 2024-02-27 LAB — HEPATITIS C ANTIBODY: Hepatitis C Ab: NONREACTIVE

## 2024-02-27 LAB — HEMOGLOBIN A1C
Hgb A1c MFr Bld: 5.6 % (ref <5.7)
Mean Plasma Glucose: 114 mg/dL
eAG (mmol/L): 6.3 mmol/L

## 2024-02-27 LAB — HIV ANTIBODY (ROUTINE TESTING W REFLEX): HIV 1&2 Ab, 4th Generation: NONREACTIVE

## 2024-02-27 LAB — TIQ-MISC: QUESTION:: 92918

## 2024-02-27 LAB — TSH: TSH: 1.15 m[IU]/L (ref 0.40–4.50)

## 2024-02-28 ENCOUNTER — Telehealth: Payer: Self-pay

## 2024-02-28 NOTE — Telephone Encounter (Signed)
 Copied from CRM [id number]. Topic: Clinical - Lab/Test Results >> Feb 28, 2024 12:07 PM Alex Mata wrote: Reason for CRM: Pt has addtl questions about lab results from 8/25. Patient is requesting a call back at [phone]

## 2024-02-29 NOTE — Telephone Encounter (Signed)
Pt informed and had no additional questions.

## 2024-04-22 ENCOUNTER — Ambulatory Visit: Admitting: Family Medicine

## 2024-04-22 ENCOUNTER — Ambulatory Visit: Admitting: Physician Assistant

## 2024-07-15 ENCOUNTER — Ambulatory Visit: Admitting: Family Medicine

## 2024-07-15 ENCOUNTER — Encounter: Payer: Self-pay | Admitting: Family Medicine

## 2024-07-15 VITALS — BP 122/88 | HR 72 | Ht 74.0 in | Wt 219.2 lb

## 2024-07-15 DIAGNOSIS — Z9189 Other specified personal risk factors, not elsewhere classified: Secondary | ICD-10-CM | POA: Diagnosis not present

## 2024-07-15 DIAGNOSIS — R5383 Other fatigue: Secondary | ICD-10-CM | POA: Diagnosis not present

## 2024-07-15 NOTE — Progress Notes (Signed)
 "  Acute Office Visit  Patient ID: Alex Mata, male    DOB: Nov 27, 1970, 54 y.o.   MRN: [id number]  PCP: Kayla Alex RAMAN, FNP  Chief Complaint  Patient presents with   Acute Visit    Lack of energy x 1 week      Subjective:     HPI  Discussed the use of AI scribe software for clinical note transcription with the patient, who gave verbal consent to proceed.  History of Present Illness Alex Mata is a 54 year old male who presents with fatigue and lack of energy.  He has been experiencing fatigue and lack of energy for the past week, particularly noticeable during physical activities. This coincides with a recent job change to more physically demanding tasks, such as walking and shoveling, compared to his previous role that involved mostly riding. He feels fine with minimal exertion, but significant physical activity leads to quick fatigue.  He sleeps approximately five hours per night, from 9 PM to 2 AM. He often wakes up at 2 AM and watches TV. He lives with his son, who has mentioned that he snores, though the severity is unknown. No shortness of breath, chest pain, or recent illness. He has gained weight over the past year, from 180 to 210 pounds, but no recent weight fluctuations.  He does not take any medications regularly but uses a B12 vitamin capsule for energy. He denies any history of B12 deficiency and has not been diagnosed with sleep apnea. He occasionally drinks Dr. Nunzio in the evening but primarily drinks water with meals. He wants to improve his diet and avoid eating right before bed. No issues with libido, depression, or anxiety.   Review of Systems  All other systems reviewed and are negative.   Past Medical History:  Diagnosis Date   Hypertension     Past Surgical History:  Procedure Laterality Date   COSMETIC SURGERY      Outpatient Medications Prior to Visit  Medication Sig Dispense Refill   diphenhydramine-acetaminophen  (TYLENOL  PM) 25-500  MG TABS tablet Take 1 tablet by mouth at bedtime as needed (to help w/ sleep).     vitamin B-12 (CYANOCOBALAMIN) 100 MCG tablet Take 100 mcg by mouth daily.     No facility-administered medications prior to visit.    Allergies[1]     Objective:    BP 122/88   Pulse 72   Ht 6' 2 (1.88 m)   Wt 219 lb 3.2 oz (99.4 kg)   SpO2 98%   BMI 28.14 kg/m  BP Readings from Last 3 Encounters:  07/15/24 122/88  02/26/24 128/88  09/28/15 125/82   Wt Readings from Last 3 Encounters:  07/15/24 219 lb 3.2 oz (99.4 kg)  02/26/24 211 lb 12.8 oz (96.1 kg)  09/19/15 185 lb (83.9 kg)      Physical Exam Vitals and nursing note reviewed.  Constitutional:      Appearance: Normal appearance. He is normal weight.  HENT:     Head: Normocephalic and atraumatic.  Cardiovascular:     Rate and Rhythm: Normal rate and regular rhythm.     Pulses: Normal pulses.     Heart sounds: Normal heart sounds.  Pulmonary:     Effort: Pulmonary effort is normal.     Breath sounds: Normal breath sounds.  Skin:    General: Skin is warm and dry.     Capillary Refill: Capillary refill takes less than 2 seconds.  Neurological:  General: No focal deficit present.     Mental Status: He is alert and oriented to person, place, and time. Mental status is at baseline.  Psychiatric:        Mood and Affect: Mood normal.        Behavior: Behavior normal.        Thought Content: Thought content normal.        Judgment: Judgment normal.       No results found for any visits on 07/15/24.     Assessment & Plan:   Problem List Items Addressed This Visit       Other   Other fatigue - Primary   Relevant Orders   CBC with Differential/Platelet   Comprehensive metabolic panel with GFR   VITAMIN D  25 Hydroxy (Vit-D Deficiency, Fractures)   TSH   Testosterone  , Free and Total   B12 and Folate Panel   Iron, TIBC and Ferritin Panel   Ambulatory referral to Sleep Studies   At risk for sleep apnea   Relevant  Orders   Ambulatory referral to Sleep Studies    Assessment and Plan Assessment & Plan Fatigue and evaluation for sleep apnea Fatigue possibly due to sleep apnea, anemia, thyroid dysfunction, vitamin deficiencies, or testosterone  deficiency. - No red flags on exam.  - Ordered blood work: thyroid levels, CBC, B12, iron, testosterone , vitamin D . - Referred for sleep study for sleep apnea evaluation. - Provided sleep hygiene education: avoid TV before bed, maintain sleep routine, avoid evening caffeine/nicotine. - Recommended melatonin 30 minutes before bed. - Advised multivitamin and vitamin D  supplement. - Follow up if symptoms persist or worsen.   General health maintenance Emphasized nutritious diet and healthy lifestyle for overall well-being. - Encouraged balanced diet with fruits and vegetables. - Advised against late-night eating and caffeine consumption.    No orders of the defined types were placed in this encounter.   Return if symptoms worsen or fail to improve.  Alex GORMAN Barrio, FNP [facility]      [1] No Known Allergies  "

## 2024-07-16 ENCOUNTER — Telehealth: Payer: Self-pay

## 2024-07-16 NOTE — Telephone Encounter (Signed)
 Copied from CRM [id number]. Topic: Clinical - Request for Lab/Test Order >> Jul 16, 2024  9:49 AM Vena HERO wrote: Reason for CRM: Pt called to check lab results. I informed him we are still waiting for one test to come back and that he will receive a call soon as all results are available.

## 2024-07-18 ENCOUNTER — Ambulatory Visit: Payer: Self-pay | Admitting: Family Medicine

## 2024-07-18 ENCOUNTER — Other Ambulatory Visit: Payer: Self-pay | Admitting: Family Medicine

## 2024-07-18 DIAGNOSIS — R17 Unspecified jaundice: Secondary | ICD-10-CM

## 2024-07-18 LAB — CBC WITH DIFFERENTIAL/PLATELET
Absolute Lymphocytes: 971 {cells}/uL (ref 850–3900)
Absolute Monocytes: 313 {cells}/uL (ref 200–950)
Basophils Absolute: 18 {cells}/uL (ref 0–200)
Basophils Relative: 0.4 %
Eosinophils Absolute: 9 {cells}/uL — ABNORMAL LOW (ref 15–500)
Eosinophils Relative: 0.2 %
HCT: 45 % (ref 39.4–51.1)
Hemoglobin: 15.1 g/dL (ref 13.2–17.1)
MCH: 31.3 pg (ref 27.0–33.0)
MCHC: 33.6 g/dL (ref 31.6–35.4)
MCV: 93.4 fL (ref 81.4–101.7)
MPV: 10.7 fL (ref 7.5–12.5)
Monocytes Relative: 6.8 %
Neutro Abs: 3289 {cells}/uL (ref 1500–7800)
Neutrophils Relative %: 71.5 %
Platelets: 253 Thousand/uL (ref 140–400)
RBC: 4.82 Million/uL (ref 4.20–5.80)
RDW: 12 % (ref 11.0–15.0)
Total Lymphocyte: 21.1 %
WBC: 4.6 Thousand/uL (ref 3.8–10.8)

## 2024-07-18 LAB — COMPREHENSIVE METABOLIC PANEL WITH GFR
AG Ratio: 2.3 (calc) (ref 1.0–2.5)
ALT: 22 U/L (ref 9–46)
AST: 25 U/L (ref 10–35)
Albumin: 4.8 g/dL (ref 3.6–5.1)
Alkaline phosphatase (APISO): 73 U/L (ref 35–144)
BUN: 12 mg/dL (ref 7–25)
CO2: 27 mmol/L (ref 20–32)
Calcium: 9.4 mg/dL (ref 8.6–10.3)
Chloride: 103 mmol/L (ref 98–110)
Creat: 1 mg/dL (ref 0.70–1.30)
Globulin: 2.1 g/dL (ref 1.9–3.7)
Glucose, Bld: 89 mg/dL (ref 65–99)
Potassium: 4.7 mmol/L (ref 3.5–5.3)
Sodium: 139 mmol/L (ref 135–146)
Total Bilirubin: 1.5 mg/dL — ABNORMAL HIGH (ref 0.2–1.2)
Total Protein: 6.9 g/dL (ref 6.1–8.1)
eGFR: 91 mL/min/1.73m2

## 2024-07-18 LAB — IRON,TIBC AND FERRITIN PANEL
%SAT: 38 % (ref 20–48)
Ferritin: 129 ng/mL (ref 38–380)
Iron: 123 ug/dL (ref 50–180)
TIBC: 328 ug/dL (ref 250–425)

## 2024-07-18 LAB — TESTOSTERONE, FREE & TOTAL
Free Testosterone: 39 pg/mL (ref 35.0–155.0)
Testosterone, Total, LC-MS-MS: 413 ng/dL (ref 250–1100)

## 2024-07-18 LAB — B12 AND FOLATE PANEL
Folate: 21.4 ng/mL
Vitamin B-12: 362 pg/mL (ref 200–1100)

## 2024-07-18 LAB — TSH: TSH: 1.26 m[IU]/L (ref 0.40–4.50)

## 2024-07-18 LAB — VITAMIN D 25 HYDROXY (VIT D DEFICIENCY, FRACTURES): Vit D, 25-Hydroxy: 37 ng/mL (ref 30–100)

## 2024-07-18 NOTE — Telephone Encounter (Signed)
 Copied from CRM #[id number]. Topic: Clinical - Lab/Test Results >> Jul 18, 2024  8:50 AM Alex Mata wrote: Reason for CRM: Patient calling for lab results, advised not reviewed, is requesting a callback once reviewed.   Patient can be reached at [phone] >> Jul 18, 2024  4:31 PM [name] wrote: Pt calling to be advised of lab results. I advised pt that they have not been viewed by PCP yet. I let him know as soon as they have been resulted, the CMA will call him.

## 2024-07-18 NOTE — Telephone Encounter (Signed)
 Copied from CRM #[id number]. Topic: Clinical - Lab/Test Results >> Jul 18, 2024  8:50 AM Alex Mata wrote: Reason for CRM: Patient calling for lab results, advised not reviewed, is requesting a callback once reviewed.   Patient can be reached at [phone]

## 2024-07-22 ENCOUNTER — Telehealth: Payer: Self-pay

## 2024-07-22 NOTE — Telephone Encounter (Signed)
 Copied from CRM [id number]. Topic: Clinical - Lab/Test Results >> Jul 22, 2024  3:45 PM Fonda T wrote: Reason for CRM: Pt calling requesting to speak to office in reference to lab results and to discuss further, with questions regarding Bilirubin.  Pt can be reached at [phone].  Pt aware of call back.

## 2024-07-26 ENCOUNTER — Telehealth: Payer: Self-pay

## 2024-07-26 ENCOUNTER — Other Ambulatory Visit (HOSPITAL_COMMUNITY)
Admission: RE | Admit: 2024-07-26 | Discharge: 2024-07-26 | Disposition: A | Source: Ambulatory Visit | Attending: Family Medicine | Admitting: Family Medicine

## 2024-07-26 DIAGNOSIS — R17 Unspecified jaundice: Secondary | ICD-10-CM | POA: Diagnosis present

## 2024-07-26 LAB — BILIRUBIN, FRACTIONATED(TOT/DIR/INDIR)
Bilirubin, Direct: 0.6 mg/dL — ABNORMAL HIGH (ref 0.0–0.2)
Indirect Bilirubin: 1.1 mg/dL — ABNORMAL HIGH (ref 0.3–0.9)
Total Bilirubin: 1.7 mg/dL — ABNORMAL HIGH (ref 0.0–1.2)

## 2024-07-26 NOTE — Telephone Encounter (Signed)
 Copied from CRM [id number]. Topic: Clinical - Lab/Test Results >> Jul 22, 2024  3:45 PM Fonda T wrote: Reason for CRM: Pt calling requesting to speak to office in reference to lab results and to discuss further, with questions regarding Bilirubin.  Pt can be reached at [phone].  Pt aware of call back. >> Jul 26, 2024 11:47 AM Antwanette L wrote: Pt is calling back about lab results. The pt was informed that the provider has not yet left any notes or recommendations. The pt is requesting a callback at [phone]

## 2024-07-26 NOTE — Telephone Encounter (Signed)
 Addressed in separate TE encounter. My Chart message sent. Mjp,lpn  Copied from CRM [id number]. Topic: Clinical - Lab/Test Results >> Jul 22, 2024  3:45 PM Fonda T wrote: Reason for CRM: Pt calling requesting to speak to office in reference to lab results and to discuss further, with questions regarding Bilirubin.  Pt can be reached at [phone].  Pt aware of call back. >> Jul 26, 2024  2:01 PM Macario HERO wrote: Patient friend Harlene called stated that patient needs someone to call him to explain his results.  >> Jul 26, 2024 11:47 AM Antwanette L wrote: Pt is calling back about lab results. The pt was informed that the provider has not yet left any notes or recommendations. The pt is requesting a callback at [phone]

## 2024-07-30 ENCOUNTER — Ambulatory Visit: Payer: Self-pay | Admitting: Family Medicine

## 2024-07-31 NOTE — Telephone Encounter (Signed)
 Pt called and verbalized understanding of labs.

## 2024-08-02 NOTE — Telephone Encounter (Signed)
 Pt reviewed providers message on 07/31/2024 . Ma called to ensure that pt. Was in understanding of results on 01/30 /26 there was no answer. Lvm for pt. To call office back if there was any questions.

## 2025-02-26 ENCOUNTER — Encounter: Admitting: Family Medicine
